# Patient Record
Sex: Female | Born: 1990 | Race: White | Hispanic: No | Marital: Single | Smoking: Current every day smoker
Health system: Southern US, Community
[De-identification: ages and names within clinical notes are randomized; demographics above are authoritative.]

## PROBLEM LIST (undated history)

## (undated) ENCOUNTER — Inpatient Hospital Stay: Payer: Self-pay

## (undated) DIAGNOSIS — F4310 Post-traumatic stress disorder, unspecified: Secondary | ICD-10-CM

## (undated) DIAGNOSIS — F99 Mental disorder, not otherwise specified: Secondary | ICD-10-CM

## (undated) DIAGNOSIS — F419 Anxiety disorder, unspecified: Secondary | ICD-10-CM

## (undated) DIAGNOSIS — F329 Major depressive disorder, single episode, unspecified: Secondary | ICD-10-CM

## (undated) DIAGNOSIS — F32A Depression, unspecified: Secondary | ICD-10-CM

## (undated) HISTORY — PX: APPENDECTOMY: SHX54

## (undated) HISTORY — PX: DILATION AND CURETTAGE OF UTERUS: SHX78

## (undated) HISTORY — PX: CHOLECYSTECTOMY: SHX55

---
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
## 2010-02-14 ENCOUNTER — Emergency Department: Payer: Self-pay | Admitting: Emergency Medicine

## 2010-02-18 ENCOUNTER — Other Ambulatory Visit: Payer: Self-pay | Admitting: Obstetrics & Gynecology

## 2010-04-04 ENCOUNTER — Emergency Department: Payer: Self-pay | Admitting: Emergency Medicine

## 2010-05-12 ENCOUNTER — Emergency Department: Payer: Self-pay | Admitting: Emergency Medicine

## 2011-03-29 ENCOUNTER — Emergency Department: Payer: Self-pay | Admitting: Emergency Medicine

## 2011-03-29 LAB — URINALYSIS, COMPLETE
Glucose,UR: NEGATIVE mg/dL (ref 0–75)
Ketone: NEGATIVE
Ph: 5 (ref 4.5–8.0)
Protein: NEGATIVE
RBC,UR: 2 /HPF (ref 0–5)
Specific Gravity: 1.023 (ref 1.003–1.030)

## 2016-02-14 ENCOUNTER — Encounter: Payer: Self-pay | Admitting: Emergency Medicine

## 2016-02-14 ENCOUNTER — Emergency Department
Admission: EM | Admit: 2016-02-14 | Discharge: 2016-02-15 | Disposition: A | Discharge status: Home / Self Care | Payer: Medicaid Other | Attending: Emergency Medicine | Admitting: Emergency Medicine

## 2016-02-14 DIAGNOSIS — F17210 Nicotine dependence, cigarettes, uncomplicated: Secondary | ICD-10-CM | POA: Insufficient documentation

## 2016-02-14 DIAGNOSIS — Z5181 Encounter for therapeutic drug level monitoring: Secondary | ICD-10-CM | POA: Insufficient documentation

## 2016-02-14 DIAGNOSIS — Z2913 Encounter for prophylactic Rho(D) immune globulin: Secondary | ICD-10-CM | POA: Diagnosis not present

## 2016-02-14 DIAGNOSIS — F329 Major depressive disorder, single episode, unspecified: Secondary | ICD-10-CM

## 2016-02-14 DIAGNOSIS — Z3A01 Less than 8 weeks gestation of pregnancy: Secondary | ICD-10-CM | POA: Diagnosis not present

## 2016-02-14 DIAGNOSIS — O99331 Smoking (tobacco) complicating pregnancy, first trimester: Secondary | ICD-10-CM | POA: Diagnosis not present

## 2016-02-14 DIAGNOSIS — Z3491 Encounter for supervision of normal pregnancy, unspecified, first trimester: Secondary | ICD-10-CM

## 2016-02-14 DIAGNOSIS — F32A Depression, unspecified: Secondary | ICD-10-CM

## 2016-02-14 DIAGNOSIS — O99341 Other mental disorders complicating pregnancy, first trimester: Secondary | ICD-10-CM | POA: Diagnosis present

## 2016-02-14 LAB — URINE DRUG SCREEN, QUALITATIVE (ARMC ONLY)
Amphetamines, Ur Screen: NOT DETECTED
Barbiturates, Ur Screen: NOT DETECTED
Benzodiazepine, Ur Scrn: POSITIVE — AB
CANNABINOID 50 NG, UR ~~LOC~~: POSITIVE — AB
COCAINE METABOLITE, UR ~~LOC~~: NOT DETECTED
MDMA (ECSTASY) UR SCREEN: NOT DETECTED
Methadone Scn, Ur: NOT DETECTED
Opiate, Ur Screen: NOT DETECTED
PHENCYCLIDINE (PCP) UR S: NOT DETECTED
Tricyclic, Ur Screen: NOT DETECTED

## 2016-02-14 LAB — COMPREHENSIVE METABOLIC PANEL
ALK PHOS: 70 U/L (ref 38–126)
ALT: 20 U/L (ref 14–54)
AST: 21 U/L (ref 15–41)
Albumin: 4.5 g/dL (ref 3.5–5.0)
Anion gap: 8 (ref 5–15)
BUN: 9 mg/dL (ref 6–20)
CALCIUM: 8.9 mg/dL (ref 8.9–10.3)
CHLORIDE: 106 mmol/L (ref 101–111)
CO2: 24 mmol/L (ref 22–32)
CREATININE: 0.62 mg/dL (ref 0.44–1.00)
GFR calc Af Amer: >60 mL/min (ref >60)
Glucose, Bld: 78 mg/dL (ref 65–99)
Potassium: 3.6 mmol/L (ref 3.5–5.1)
Sodium: 138 mmol/L (ref 135–145)
Total Bilirubin: 0.4 mg/dL (ref 0.3–1.2)
Total Protein: 8.1 g/dL (ref 6.5–8.1)

## 2016-02-14 LAB — CBC
HCT: 45.1 % (ref 35.0–47.0)
HEMOGLOBIN: 15.3 g/dL (ref 12.0–16.0)
MCH: 29.8 pg (ref 26.0–34.0)
MCHC: 33.8 g/dL (ref 32.0–36.0)
MCV: 88.2 fL (ref 80.0–100.0)
Platelets: 199 10*3/uL (ref 150–440)
RBC: 5.12 MIL/uL (ref 3.80–5.20)
RDW: 14 % (ref 11.5–14.5)
WBC: 10.7 10*3/uL (ref 3.6–11.0)

## 2016-02-14 LAB — ETHANOL

## 2016-02-14 LAB — POCT PREGNANCY, URINE: Preg Test, Ur: POSITIVE — AB

## 2016-02-14 LAB — ACETAMINOPHEN LEVEL: Acetaminophen (Tylenol), Serum: <10 ug/mL — ABNORMAL LOW (ref 10–30)

## 2016-02-14 LAB — SALICYLATE LEVEL: Salicylate Lvl: <7 mg/dL (ref 2.8–30.0)

## 2016-02-14 NOTE — ED Notes (Addendum)
Pt dressed out and belongings placed in belongings bag. (Pink shoes, blue jeans, black pants, tan sweat shirt, grey hoodie, hair tie and cell phone). Pt has lip piercing. Pt tried to take it off and couldn't and this tech tried to take it off as well and couldn't. Amy informed about this.

## 2016-02-14 NOTE — ED Notes (Signed)
Pt feels like she is having a panic attack, hx of the same.

## 2016-02-14 NOTE — ED Notes (Signed)
Yvonne Shea [phone], pt's boyfriend phone number left on chart when pt ready for d/c

## 2016-02-14 NOTE — ED Triage Notes (Addendum)
Patient ambulatory to triage with steady gait, without difficulty; pt tearful and anxious, bouncing leg rapidly; pt reports depression, crying spells x 3 days; st on no meds for such; when questioned pt regarding SI, st "well kinda but I don't really want to say"

## 2016-02-14 NOTE — ED Provider Notes (Signed)
[facility] Emergency Department Provider Note  ____________________________________________  Time seen: 11:21 PM  I have reviewed the triage vital signs and the nursing notes.   HISTORY  Chief Complaint Mental Health Problem      HPI Yvonne Shea is a 25 y.o. female presents with feeling depressed secondary to separation from her children who are "100 miles away". States that she's been expressing crying spells with past 3 days. Patient denies any suicidal ideation or homicidal ideation. Urine pregnancy obtained by the nursing staff arrival to the emergency department was positive. Patient states her last menstrual period ended 3 days ago.     History reviewed. No pertinent past medical history.  There are no active problems to display for this patient.   Past Surgical History:  Procedure Laterality Date  . APPENDECTOMY    . CESAREAN SECTION    . CHOLECYSTECTOMY    . DILATION AND CURETTAGE OF UTERUS        Allergies Patient has no known allergies.  No family history on file.  Social History Social History  Substance Use Topics  . Smoking status: Current Every Day Smoker    Packs/day: 1.00    Types: Cigarettes  . Smokeless tobacco: Never Used  . Alcohol use No    Review of Systems  Constitutional: Negative for fever. Eyes: Negative for visual changes. ENT: Negative for sore throat. Cardiovascular: Negative for chest pain. Respiratory: Negative for shortness of breath. Gastrointestinal: Negative for abdominal pain, vomiting and diarrhea. Genitourinary: Negative for dysuria. Musculoskeletal: Negative for back pain. Skin: Negative for rash. Neurological: Negative for headaches, focal weakness or numbness. Psychiatric:Positive for depression  10-point ROS otherwise negative.  ____________________________________________   PHYSICAL EXAM:  VITAL SIGNS: ED Triage Vitals  Enc Vitals Group     BP 02/14/16 2253 101/78   Pulse Rate 02/14/16 2253 (!) 112     Resp 02/14/16 2253 18     Temp 02/14/16 2253 98 F (36.7 C)     Temp src --      SpO2 02/14/16 2253 100 %     Weight 02/14/16 2252 150 lb (68 kg)     Height 02/14/16 2252 5\' 4"  (1.626 m)     Head Circumference --      Peak Flow --      Pain Score --      Pain Loc --      Pain Edu? --      Excl. in GC? --      Constitutional: Alert and oriented. Well appearing and in no distress. Eyes: Conjunctivae are normal. PERRL. Normal extraocular movements. ENT   Head: Normocephalic and atraumatic.   Nose: No congestion/rhinnorhea.   Mouth/Throat: Mucous membranes are moist.   Neck: No stridor. Hematological/Lymphatic/Immunilogical: No cervical lymphadenopathy. Cardiovascular: Normal rate, regular rhythm. Normal and symmetric distal pulses are present in all extremities. No murmurs, rubs, or gallops. Respiratory: Normal respiratory effort without tachypnea nor retractions. Breath sounds are clear and equal bilaterally. No wheezes/rales/rhonchi. Gastrointestinal: Soft and nontender. No distention. There is no CVA tenderness. Genitourinary: deferred Musculoskeletal: Nontender with normal range of motion in all extremities. No joint effusions.  No lower extremity tenderness nor edema. Neurologic:  Normal speech and language. No gross focal neurologic deficits are appreciated. Speech is normal.  Skin:  Skin is warm, dry and intact. No rash noted. Psychiatric: Mood and affect are normal. Speech and behavior are normal. Patient exhibits appropriate insight and judgment.  ____________________________________________    LABS (pertinent positives/negatives)  Labs Reviewed  ACETAMINOPHEN LEVEL - Abnormal; Notable for the following:       Result Value   Acetaminophen (Tylenol), Serum <10 (*)    All other components within normal limits  URINE DRUG SCREEN, QUALITATIVE ([facility] ONLY) - Abnormal; Notable for the following:    Cannabinoid 50 Ng, Ur [place]  POSITIVE (*)    Benzodiazepine, Ur Scrn POSITIVE (*)    All other components within normal limits  HCG, QUANTITATIVE, PREGNANCY - Abnormal; Notable for the following:    hCG, Beta Chain, Quant, S 958 (*)    All other components within normal limits  POCT PREGNANCY, URINE - Abnormal; Notable for the following:    Preg Test, Ur POSITIVE (*)    All other components within normal limits  COMPREHENSIVE METABOLIC PANEL  ETHANOL  SALICYLATE LEVEL  CBC  POC URINE PREG, ED  ABO/RH       RADIOLOGY  CLINICAL DATA:  Acute onset of panic attacks.  Initial encounter.  EXAM: OBSTETRIC <14 WK US AND TRANSVAGINAL OB US  TECHNIQUE: Both transabdominal and transvaginal ultrasound examinations were performed for complete evaluation of the gestation as well as the maternal uterus, adnexal regions, and pelvic cul-de-sac. Transvaginal technique was performed to assess early pregnancy.  COMPARISON:  Pelvic ultrasound performed 02/14/2010  FINDINGS: Intrauterine gestational sac: Single; visualized and normal in shape.  Yolk sac:  No  Embryo:  No  MSD: 3.0 mm   5 w   0  d  Subchorionic hemorrhage:  None visualized.  Maternal uterus/adnexae: The uterus is otherwise unremarkable.  The ovaries are within normal limits. The right ovary measures 4.0 x 1.7 x 2.7 cm, while the left ovary measures 2.9 x 1.3 x 2.0 cm. No suspicious adnexal masses are seen; there is no evidence for ovarian torsion.  No free fluid is seen within the pelvic cul-de-sac.  IMPRESSION: Single intrauterine gestational sac noted, with a mean sac diameter of 3 mm, corresponding to a gestational age of [redacted] weeks 0 days. It remains too early to determine an estimated date of delivery. No yolk sac or embryo is yet seen, within normal limits. Pelvic ultrasound could be performed in 2 weeks for further evaluation, if quantitative beta HCG continues to trend upward.   Electronically Signed   By: Roanna RaiderJeffery   Chang M.D.   On: 02/15/2016 01:42    Procedures    INITIAL IMPRESSION / ASSESSMENT AND PLAN / ED COURSE  Pertinent labs & imaging results that were available during my care of the patient were reviewed by me and considered in my medical decision making (see chart for details).  Patient noted to have a positive pregnancy tests and arrival to the emergency department. She denied any current vaginal bleeding stating that her last menstrual period ended last Friday. Ultrasound revealed no intrauterine gestational sac as mentioned above approximate gestational age of [redacted] weeks and 0 days. Patient is AB- and a such will receive Rogam  ____________________________________________   FINAL CLINICAL IMPRESSION(S) / ED DIAGNOSES  Final diagnoses:  First trimester pregnancy  Depression, unspecified depression type      Darci Currentandolph N Saachi Zale, MD 02/15/16 [phone]

## 2016-02-14 NOTE — ED Notes (Signed)
Pt states she is stressed out because she is not going to see children this week for the holiday.  Pt sates kids live 100 miles away with father and recently 1 child had surgery and mother is also sick.  Pt reports feeling anxious with chest tightness.  Pt also reports crying spells.  Pt alert and cooperative.

## 2016-02-15 ENCOUNTER — Emergency Department: Payer: Medicaid Other

## 2016-02-15 LAB — HCG, QUANTITATIVE, PREGNANCY: hCG, Beta Chain, Quant, S: 958 m[IU]/mL — ABNORMAL HIGH (ref <5)

## 2016-02-15 LAB — ANTIBODY SCREEN: ANTIBODY SCREEN: NEGATIVE

## 2016-02-15 LAB — ABO/RH: ABO/RH(D): AB NEG

## 2016-02-15 MED ORDER — RHO D IMMUNE GLOBULIN 1500 UNIT/2ML IJ SOSY
300.0000 ug | PREFILLED_SYRINGE | Freq: Once | INTRAMUSCULAR | Status: AC
  Administered 2016-02-15: 300 ug via INTRAMUSCULAR
  Filled 2016-02-15: qty 2

## 2016-02-15 NOTE — ED Notes (Signed)
Pt in u/s

## 2016-02-15 NOTE — ED Notes (Signed)
Pt resting eyes closed

## 2016-02-15 NOTE — ED Provider Notes (Signed)
-----------------------------------------   12:52 PM on 02/15/2016 -----------------------------------------   Blood pressure (!) 96/49, pulse 68, temperature 98.3 F (36.8 C), temperature source Oral, resp. rate 20, height 5\' 4"  (1.626 m), weight 150 lb (68 kg), SpO2 100 %.  Patient is here voluntarily for depressive symptoms. No SI or HI. Patient does not wish to wait for psychiatry evaluation anymore. I reassessed her she continues to deny any suicidal or homicidal ideation. Patient received RhoGAM for vaginal bleeding in the first trimester of her pregnancy as she is AB neg. Ultrasound showing an IUP at 5 weeks and 0 days. Tox screen positive for marijuana and benzos. Remaining of her blood work is within normal limits with no acute findings. I encouraged patient to return to the emergency room if she continues to feel depressed or she develops any suicidal or homicidal ideations. Patient is in agreement. We'll discharge her home at this time.   Nita Sicklearolina Keyontay Stolz, MD 02/15/16 [phone]

## 2016-02-15 NOTE — Discharge Instructions (Signed)
You have been seen in the Emergency Department (ED)  today for a psychiatric complaint.  You have been evaluated by psychiatry and we believe you are safe to be discharged from the hospital.   ° °Please return to the Emergency Department (ED)  immediately if you have ANY thoughts of hurting yourself or anyone else, so that we may help you. ° °Please avoid alcohol and drug use. ° °Follow up with your doctor and/or therapist as soon as possible regarding today's ED  visit.  ° °You may call crisis hotline for [place] at [phone]. ° °

## 2016-02-16 LAB — RHOGAM INJECTION: UNIT DIVISION: 0

## 2016-03-06 DIAGNOSIS — F17210 Nicotine dependence, cigarettes, uncomplicated: Secondary | ICD-10-CM | POA: Insufficient documentation

## 2016-03-06 DIAGNOSIS — O99331 Smoking (tobacco) complicating pregnancy, first trimester: Secondary | ICD-10-CM | POA: Insufficient documentation

## 2016-03-06 DIAGNOSIS — O23591 Infection of other part of genital tract in pregnancy, first trimester: Secondary | ICD-10-CM | POA: Diagnosis not present

## 2016-03-06 DIAGNOSIS — O26891 Other specified pregnancy related conditions, first trimester: Secondary | ICD-10-CM | POA: Diagnosis present

## 2016-03-06 DIAGNOSIS — O2301 Infections of kidney in pregnancy, first trimester: Secondary | ICD-10-CM | POA: Diagnosis not present

## 2016-03-06 DIAGNOSIS — R102 Pelvic and perineal pain: Secondary | ICD-10-CM | POA: Insufficient documentation

## 2016-03-06 DIAGNOSIS — Z3A01 Less than 8 weeks gestation of pregnancy: Secondary | ICD-10-CM | POA: Insufficient documentation

## 2016-03-06 LAB — URINALYSIS, COMPLETE (UACMP) WITH MICROSCOPIC
BILIRUBIN URINE: NEGATIVE
Glucose, UA: NEGATIVE mg/dL
HGB URINE DIPSTICK: NEGATIVE
Ketones, ur: 5 mg/dL — AB
NITRITE: POSITIVE — AB
PH: 6 (ref 5.0–8.0)
Protein, ur: 30 mg/dL — AB
SPECIFIC GRAVITY, URINE: 1.017 (ref 1.005–1.030)

## 2016-03-06 LAB — COMPREHENSIVE METABOLIC PANEL
ALT: 13 U/L — AB (ref 14–54)
AST: 18 U/L (ref 15–41)
Albumin: 4.2 g/dL (ref 3.5–5.0)
Alkaline Phosphatase: 56 U/L (ref 38–126)
Anion gap: 8 (ref 5–15)
BILIRUBIN TOTAL: 0.7 mg/dL (ref 0.3–1.2)
BUN: 10 mg/dL (ref 6–20)
CALCIUM: 9.3 mg/dL (ref 8.9–10.3)
CO2: 25 mmol/L (ref 22–32)
CREATININE: 0.6 mg/dL (ref 0.44–1.00)
Chloride: 103 mmol/L (ref 101–111)
GFR calc Af Amer: >60 mL/min (ref >60)
Glucose, Bld: 112 mg/dL — ABNORMAL HIGH (ref 65–99)
Potassium: 3.4 mmol/L — ABNORMAL LOW (ref 3.5–5.1)
Sodium: 136 mmol/L (ref 135–145)
TOTAL PROTEIN: 7.9 g/dL (ref 6.5–8.1)

## 2016-03-06 LAB — CBC
HCT: 42.6 % (ref 35.0–47.0)
Hemoglobin: 14.6 g/dL (ref 12.0–16.0)
MCH: 30.5 pg (ref 26.0–34.0)
MCHC: 34.2 g/dL (ref 32.0–36.0)
MCV: 89 fL (ref 80.0–100.0)
PLATELETS: 202 10*3/uL (ref 150–440)
RBC: 4.78 MIL/uL (ref 3.80–5.20)
RDW: 14.2 % (ref 11.5–14.5)
WBC: 11.9 10*3/uL — AB (ref 3.6–11.0)

## 2016-03-06 LAB — HCG, QUANTITATIVE, PREGNANCY: hCG, Beta Chain, Quant, S: 62964 m[IU]/mL — ABNORMAL HIGH (ref <5)

## 2016-03-06 LAB — POCT PREGNANCY, URINE: PREG TEST UR: POSITIVE — AB

## 2016-03-06 NOTE — ED Triage Notes (Addendum)
Pt presents to ED with c/o RLQ abdominal pain and bilateral lower back pain associated with nausea and vomiting. Pt reports 7-8 episodes of emesis, denies fever. Pt is [redacted] weeks pregnant, reports some vaginal bleeding (spotting) 2 days ago, but denies bleeding or d/c at this time. Pt states increase in urinary frequency, but denies painful or burning with urination. Pt denies any previous h/x of kidney stones.

## 2016-03-07 ENCOUNTER — Emergency Department
Admission: EM | Admit: 2016-03-07 | Discharge: 2016-03-07 | Disposition: A | Discharge status: Home / Self Care | Payer: Medicaid Other | Attending: Emergency Medicine | Admitting: Emergency Medicine

## 2016-03-07 ENCOUNTER — Emergency Department: Payer: Medicaid Other

## 2016-03-07 DIAGNOSIS — B9689 Other specified bacterial agents as the cause of diseases classified elsewhere: Secondary | ICD-10-CM

## 2016-03-07 DIAGNOSIS — N10 Acute tubulo-interstitial nephritis: Secondary | ICD-10-CM

## 2016-03-07 DIAGNOSIS — N760 Acute vaginitis: Secondary | ICD-10-CM

## 2016-03-07 LAB — CHLAMYDIA/NGC RT PCR (ARMC ONLY)
Chlamydia Tr: NOT DETECTED
N GONORRHOEAE: NOT DETECTED

## 2016-03-07 LAB — WET PREP, GENITAL
SPERM: NONE SEEN
Trich, Wet Prep: NONE SEEN
YEAST WET PREP: NONE SEEN

## 2016-03-07 MED ORDER — SODIUM CHLORIDE 0.9 % IV BOLUS (SEPSIS)
1000.0000 mL | Freq: Once | INTRAVENOUS | Status: AC
  Administered 2016-03-07: 1000 mL via INTRAVENOUS

## 2016-03-07 MED ORDER — CEPHALEXIN 500 MG PO CAPS: 500.0000 mg | ORAL_CAPSULE | Freq: Four times a day (QID) | ORAL | 0 refills | Status: AC

## 2016-03-07 MED ORDER — METOCLOPRAMIDE HCL 5 MG/ML IJ SOLN
10.0000 mg | Freq: Once | INTRAMUSCULAR | Status: AC
  Administered 2016-03-07: 10 mg via INTRAVENOUS
  Filled 2016-03-07: qty 2

## 2016-03-07 MED ORDER — CEFTRIAXONE SODIUM-DEXTROSE 1-3.74 GM-% IV SOLR
1.0000 g | Freq: Once | INTRAVENOUS | Status: AC
  Administered 2016-03-07: 1 g via INTRAVENOUS
  Filled 2016-03-07: qty 50

## 2016-03-07 MED ORDER — METOCLOPRAMIDE HCL 10 MG PO TABS: 10.0000 mg | ORAL_TABLET | Freq: Three times a day (TID) | ORAL | 0 refills | Status: DC | PRN

## 2016-03-07 MED ORDER — DEXTROSE 5 % IV SOLN: 1.0000 g | Freq: Once | INTRAVENOUS | Status: DC

## 2016-03-07 MED ORDER — METRONIDAZOLE 500 MG PO TABS: 500.0000 mg | ORAL_TABLET | Freq: Two times a day (BID) | ORAL | 0 refills | Status: AC

## 2016-03-07 NOTE — ED Notes (Signed)
Pt. Transported to US at this time

## 2016-03-07 NOTE — ED Notes (Signed)
Pt reports back pain that feels like a "stabbing sharp pain." Pt also reports cramps in lower abdominal. Denies any current bleeding.

## 2016-03-07 NOTE — ED Provider Notes (Signed)
[facility] Emergency Department Provider Note   ____________________________________________   First MD Initiated Contact with Patient 03/07/16 [phone]     (approximate)  I have reviewed the triage vital signs and the nursing notes.   HISTORY  Chief Complaint Back Pain and Abdominal Pain    HPI Yvonne Shea is a 25 y.o. female who comes into the hospital today with some pelvic pain and back pain. She reports it started around 7:30 this evening. She reports that the pelvic pain is not that bad but the back pain is really bad. The patient has not taken anything. She is [redacted] weeks pregnant but has not yet seen an OB/GYN. The patient is a G5 P2 0-2. She reports that she had some spotting a couple of days ago. She denies any pain with urination. She's had some nausea and vomiting. She denies any fevers but has had some chills and hot flashes. Today she has not been eating and drinking well but she had been doing well previously. The patient rates her pain a 9 out of 10 in intensity. She is here for evaluation.   History reviewed. No pertinent past medical history.  There are no active problems to display for this patient.   Past Surgical History:  Procedure Laterality Date  . APPENDECTOMY    . CESAREAN SECTION    . CHOLECYSTECTOMY    . DILATION AND CURETTAGE OF UTERUS      Prior to Admission medications   Medication Sig Start Date End Date Taking? Authorizing Provider  cephALEXin (KEFLEX) 500 MG capsule Take 1 capsule (500 mg total) by mouth 4 (four) times daily. 03/07/16 03/17/16  Rebecka ApleyAllison P Yoshiaki Kreuser, MD  metoCLOPramide (REGLAN) 10 MG tablet Take 1 tablet (10 mg total) by mouth every 8 (eight) hours as needed. 03/07/16   Rebecka ApleyAllison P Shashana Fullington, MD  metroNIDAZOLE (FLAGYL) 500 MG tablet Take 1 tablet (500 mg total) by mouth 2 (two) times daily. 03/07/16 03/14/16  Rebecka ApleyAllison P Jaymien Landin, MD    Allergies Patient has no known allergies.  No family history on  file.  Social History Social History  Substance Use Topics  . Smoking status: Current Every Day Smoker    Packs/day: 1.00    Types: Cigarettes  . Smokeless tobacco: Never Used  . Alcohol use No    Review of Systems Constitutional: No fever/chills Eyes: No visual changes. ENT: No sore throat. Cardiovascular: Denies chest pain. Respiratory: Denies shortness of breath. Gastrointestinal: abdominal pain, nausea, vomiting.  No diarrhea.  No constipation. Genitourinary: Negative for dysuria. Musculoskeletal: back pain. Skin: Negative for rash. Neurological: Negative for headaches, focal weakness or numbness.  10-point ROS otherwise negative.  ____________________________________________   PHYSICAL EXAM:  VITAL SIGNS: ED Triage Vitals [03/06/16 2115]  Enc Vitals Group     BP (!) 113/53     Pulse Rate (!) 103     Resp 18     Temp 97.5 F (36.4 C)     Temp Source Oral     SpO2 96 %     Weight 151 lb (68.5 kg)     Height 5\' 4"  (1.626 m)     Head Circumference      Peak Flow      Pain Score 10     Pain Loc      Pain Edu?      Excl. in GC?     Constitutional: Alert and oriented. Well appearing and in mild distress Eyes: Conjunctivae are normal. PERRL. EOMI. Head:  Atraumatic. Nose: No congestion/rhinnorhea. Mouth/Throat: Mucous membranes are moist.  Oropharynx non-erythematous. Cardiovascular: Normal rate, regular rhythm. Grossly normal heart sounds.  Good peripheral circulation. Respiratory: Normal respiratory effort.  No retractions. Lungs CTAB. Gastrointestinal: Soft with some mild lower abd tenderness to palpation. No distention. No abdominal bruits. No CVA tenderness. Genitourinary: Musculoskeletal: No lower extremity tenderness nor edema.   Neurologic:  Normal speech and language.  Skin:  Skin is warm, dry and intact.  Psychiatric: Mood and affect are normal.   ____________________________________________   LABS (all labs ordered are listed, but only  abnormal results are displayed)  Labs Reviewed  WET PREP, GENITAL - Abnormal; Notable for the following:       Result Value   Clue Cells Wet Prep HPF POC PRESENT (*)    WBC, Wet Prep HPF POC MODERATE (*)    All other components within normal limits  COMPREHENSIVE METABOLIC PANEL - Abnormal; Notable for the following:    Potassium 3.4 (*)    Glucose, Bld 112 (*)    ALT 13 (*)    All other components within normal limits  CBC - Abnormal; Notable for the following:    WBC 11.9 (*)    All other components within normal limits  URINALYSIS, COMPLETE (UACMP) WITH MICROSCOPIC - Abnormal; Notable for the following:    Color, Urine YELLOW (*)    APPearance TURBID (*)    Ketones, ur 5 (*)    Protein, ur 30 (*)    Nitrite POSITIVE (*)    Leukocytes, UA TRACE (*)    Bacteria, UA FEW (*)    Squamous Epithelial / LPF 6-30 (*)    All other components within normal limits  HCG, QUANTITATIVE, PREGNANCY - Abnormal; Notable for the following:    hCG, Beta Chain, Quant, S 40,98162,964 (*)    All other components within normal limits  POCT PREGNANCY, URINE - Abnormal; Notable for the following:    Preg Test, Ur POSITIVE (*)    All other components within normal limits  URINE CULTURE  CULTURE, BLOOD (ROUTINE X 2)  CULTURE, BLOOD (ROUTINE X 2)  CHLAMYDIA/NGC RT PCR ([facility] ONLY)  POC URINE PREG, ED   ____________________________________________  EKG  none ____________________________________________  RADIOLOGY  OB [place] US renal ____________________________________________   PROCEDURES  Procedure(s) performed: None  Procedures  Critical Care performed: No  ____________________________________________   INITIAL IMPRESSION / ASSESSMENT AND PLAN / ED COURSE  Pertinent labs & imaging results that were available during my care of the patient were reviewed by me and considered in my medical decision making (see chart for details).  This is a 25 year old female who comes into the  hospital today with lower abdominal pain and back pain. The patient does appear to have a urinary tract infection. I'll give the patient a liter of normal saline and some Reglan. I'll also give her dose of ceftriaxone. I will send the patient for an ultrasound to evaluate the pregnancy and I will reassess the patient.  Clinical Course as of Mar 07 545  Tue Mar 07, 2016  0334 1. Single live intrauterine pregnancy with estimated gestational age of [redacted] weeks and 6 days by crown-rump length. 2. No acute process identified.   US OB Transvaginal [AW]  0334 Normal renal ultrasound.  No hydronephrosis. US Renal [AW]    Clinical Course User Index [AW] Rebecka ApleyAllison P Carly Sabo, MD    The patient's ultrasound estimates her gestational age of [redacted] weeks and 6 days. The patient's wet prep has  clue cells and she does have a UTI. The patient will be discharged to home to follow-up with Dr. Jean Rosenthal. ____________________________________________   FINAL CLINICAL IMPRESSION(S) / ED DIAGNOSES  Final diagnoses:  Acute pyelonephritis  Bacterial vaginosis      NEW MEDICATIONS STARTED DURING THIS VISIT:  New Prescriptions   CEPHALEXIN (KEFLEX) 500 MG CAPSULE    Take 1 capsule (500 mg total) by mouth 4 (four) times daily.   METOCLOPRAMIDE (REGLAN) 10 MG TABLET    Take 1 tablet (10 mg total) by mouth every 8 (eight) hours as needed.   METRONIDAZOLE (FLAGYL) 500 MG TABLET    Take 1 tablet (500 mg total) by mouth 2 (two) times daily.     Note:  This document was prepared using Dragon voice recognition software and may include unintentional dictation errors.    Rebecka Apley, MD 03/07/16 [phone]

## 2016-03-07 NOTE — Discharge Instructions (Signed)
Please return with worsening symptoms or any other concerns.

## 2016-03-08 LAB — URINE CULTURE

## 2016-03-12 LAB — CULTURE, BLOOD (ROUTINE X 2)
Culture: NO GROWTH
Culture: NO GROWTH

## 2016-03-30 ENCOUNTER — Other Ambulatory Visit: Payer: Self-pay | Admitting: Obstetrics and Gynecology

## 2016-03-30 DIAGNOSIS — Z369 Encounter for antenatal screening, unspecified: Secondary | ICD-10-CM

## 2016-04-17 ENCOUNTER — Ambulatory Visit (HOSPITAL_BASED_OUTPATIENT_CLINIC_OR_DEPARTMENT_OTHER)
Admission: RE | Admit: 2016-04-17 | Discharge: 2016-04-17 | Disposition: A | Discharge status: Home / Self Care | Payer: Medicaid Other | Source: Ambulatory Visit | Attending: Obstetrics and Gynecology | Admitting: Obstetrics and Gynecology

## 2016-04-17 ENCOUNTER — Encounter: Payer: Self-pay | Admitting: *Deleted

## 2016-04-17 ENCOUNTER — Ambulatory Visit
Admission: RE | Admit: 2016-04-17 | Discharge: 2016-04-17 | Disposition: A | Discharge status: Home / Self Care | Payer: Medicaid Other | Source: Ambulatory Visit | Attending: Obstetrics and Gynecology | Admitting: Obstetrics and Gynecology

## 2016-04-17 VITALS — BP 110/59 | HR 94 | Temp 97.5°F | Resp 17 | Ht 64.0 in | Wt 150.0 lb

## 2016-04-17 DIAGNOSIS — Z369 Encounter for antenatal screening, unspecified: Secondary | ICD-10-CM

## 2016-04-17 DIAGNOSIS — Z3A13 13 weeks gestation of pregnancy: Secondary | ICD-10-CM | POA: Diagnosis not present

## 2016-04-17 DIAGNOSIS — Z3480 Encounter for supervision of other normal pregnancy, unspecified trimester: Secondary | ICD-10-CM | POA: Insufficient documentation

## 2016-04-17 NOTE — Progress Notes (Signed)
Yvonne Wells, MS, CGC performed an integral service incident to the physician's initial service.  I was physically present in the clinical area and was immediately available to render assistance.   [name] C [name]  

## 2016-04-17 NOTE — Progress Notes (Addendum)
Referring physician:  [facility] Length of Consultation: 45 minutes   Yvonne Shea  was referred to [facility] for genetic counseling to review prenatal screening and testing options.  This note summarizes the information we discussed.    We offered the following routine screening tests for this pregnancy:  First trimester screening, which includes nuchal translucency ultrasound screen and first trimester maternal serum marker screening.  The nuchal translucency has approximately an 80% detection rate for Down syndrome and can be positive for other chromosome abnormalities as well as congenital heart defects.  When combined with a maternal serum marker screening, the detection rate is up to 90% for Down syndrome and up to 97% for trisomy 18.     Maternal serum marker screening, a blood test that measures pregnancy proteins, can provide risk assessments for Down syndrome, trisomy 18, and open neural tube defects (spina bifida, anencephaly). Because it does not directly examine the fetus, it cannot positively diagnose or rule out these problems.  Targeted ultrasound uses high frequency sound waves to create an image of the developing fetus.  An ultrasound is often recommended as a routine means of evaluating the pregnancy.  It is also used to screen for fetal anatomy problems (for example, a heart defect) that might be suggestive of a chromosomal or other abnormality.   Should these screening tests indicate an increased concern, then the following additional testing options would be offered:  The chorionic villus sampling procedure is available for first trimester chromosome analysis.  This involves the withdrawal of a small amount of chorionic villi (tissue from the developing placenta).  Risk of pregnancy loss is estimated to be approximately 1 in 200 to 1 in 100 (0.5 to 1%).  There is approximately a 1% (1 in 100) chance that the CVS chromosome results will be unclear.   Chorionic villi cannot be tested for neural tube defects.     Amniocentesis involves the removal of a small amount of amniotic fluid from the sac surrounding the fetus with the use of a thin needle inserted through the maternal abdomen and uterus.  Ultrasound guidance is used throughout the procedure.  Fetal cells from amniotic fluid are directly evaluated and > 99.5% of chromosome problems and > 98% of open neural tube defects can be detected. This procedure is generally performed after the 15th week of pregnancy.  The main risks to this procedure include complications leading to miscarriage in less than 1 in 200 cases (0.5%).  As another option for information if the pregnancy is suspected to be an an increased chance for certain chromosome conditions, we also reviewed the availability of cell free fetal DNA testing from maternal blood to determine whether or not the baby may have either Down syndrome, trisomy 68, or trisomy 63.  This test utilizes a maternal blood sample and DNA sequencing technology to isolate circulating cell free fetal DNA from maternal plasma.  The fetal DNA can then be analyzed for DNA sequences that are derived from the three most common chromosomes involved in aneuploidy, chromosomes 13, 18, and 21.  If the overall amount of DNA is greater than the expected level for any of these chromosomes, aneuploidy is suspected.  While we do not consider it a replacement for invasive testing and karyotype analysis, a negative result from this testing would be reassuring, though not a guarantee of a normal chromosome complement for the baby.  An abnormal result is certainly suggestive of an abnormal chromosome complement, though  we would still recommend CVS or amniocentesis to confirm any findings from this testing.  Cystic Fibrosis and Spinal Muscular Atrophy (SMA) screening were also discussed with the patient. Both conditions are recessive, which means that both parents must be carriers in  order to have a child with the disease.  Cystic fibrosis (CF) is one of the most common genetic conditions in persons of Caucasian ancestry.  This condition occurs in approximately 1 in 2,500 Caucasian persons and results in thickened secretions in the lungs, digestive, and reproductive systems.  For a baby to be at risk for having CF, both of the parents must be carriers for this condition.  Approximately 1 in 6025 Caucasian persons is a carrier for CF.  Current carrier testing looks for the most common mutations in the gene for CF and can detect approximately 90% of carriers in the Caucasian population.  This means that the carrier screening can greatly reduce, but cannot eliminate, the chance for an individual to have a child with CF.  If an individual is found to be a carrier for CF, then carrier testing would be available for the partner. As part of [place]'s newborn screening profile, all babies born in the state of [place] will have a two-tier screening process.  Specimens are first tested to determine the concentration of immunoreactive trypsinogen (IRT).  The top 5% of specimens with the highest IRT values then undergo DNA testing using a panel of over 40 common CF mutations. SMA is a neurodegenerative disorder that leads to atrophy of skeletal muscle and overall weakness.  This condition is also more prevalent in the Caucasian population, with 1 in 40-1 in 60 persons being a carrier and 1 in 6,000-1 in 10,000 children being affected.  There are multiple forms of the disease, with some causing death in infancy to other forms with survival into adulthood.  The genetics of SMA is complex, but carrier screening can detect up to 95% of carriers in the Caucasian population.  Similar to CF, a negative result can greatly reduce, but cannot eliminate, the chance to have a child with SMA.  The patient is of Caucasian background, and the father of the baby is reported to be mixed Caucasian and African  American.  We therefore also offered hemoglobinopathy testing to Yvonne Shea.  We obtained a detailed family history and pregnancy history.  The family history was reported to be unremarkable for birth defects, mental retardation, recurrent pregnancy loss or known chromosome abnormalities.  The patient and her mother are said to have ADHD, which we reviewed may have strong genetic factors in some families.  The father of the baby is said to have had a "leaky heart valve" as a baby that resolved without any treatment.  We would not expect this history to significantly increase the risk for birth defects in this pregnancy.  Yvonne Shea stated that this is her fifth pregnancy, the first with the father of this baby.  However, she is no longer with him and came today with her new boyfriend.  She has two healthy daughters, ages 2 and 6 years.  She also had two early miscarriages with prior partners.  She reported no complications or exposures in this pregnancy that would be expected to increase the risk for birth defects.  After consideration of the options, Yvonne Shea elected to proceed with carrier testing for hemoglobinopathies, CF and SMA.  She desired first trimester screening, however, this could not be completed due to the ultrasound  measurements placing the gestational age above the cutoff of 13 weeks, 6 days.  An ultrasound was performed at the time of the visit.  The crown rump length was too large for first trimester screening to be completed.  We therefore let her know about the option of second trimester maternal serum screening at [facility].  Fetal anatomy could not be assessed due to early gestational age, so we would recommend an ultrasound for anatomy after [redacted] weeks gestation.  Please refer to the ultrasound report for details of that study.  Yvonne Shea was encouraged to call with questions or concerns.  We can be contacted at [phone].    Cherly Anderson, MS, CGC

## 2016-04-18 LAB — MISC LABCORP TEST (SEND OUT): LABCORP TEST CODE: 450010

## 2016-04-18 LAB — HEMOGLOBINOPATHY EVALUATION
HGB C: 0 %
HGB S QUANTITAION: 0 %
Hgb A2 Quant: 2.3 % (ref 1.8–3.2)
Hgb A: 97.7 % (ref 96.4–98.8)
Hgb F Quant: 0 % (ref 0.0–2.0)
Hgb Variant: 0 %

## 2016-04-20 LAB — CYSTIC FIBROSIS GENE TEST

## 2016-05-28 ENCOUNTER — Encounter: Payer: Self-pay | Admitting: Emergency Medicine

## 2016-05-28 ENCOUNTER — Observation Stay
Admission: EM | Admit: 2016-05-28 | Discharge: 2016-05-29 | Disposition: A | Discharge status: Other Acute Inpatient Hospital | Payer: Medicaid Other | Attending: Obstetrics and Gynecology | Admitting: Obstetrics and Gynecology

## 2016-05-28 DIAGNOSIS — F17210 Nicotine dependence, cigarettes, uncomplicated: Secondary | ICD-10-CM | POA: Insufficient documentation

## 2016-05-28 DIAGNOSIS — O479 False labor, unspecified: Secondary | ICD-10-CM | POA: Diagnosis present

## 2016-05-28 DIAGNOSIS — O99332 Smoking (tobacco) complicating pregnancy, second trimester: Secondary | ICD-10-CM | POA: Diagnosis not present

## 2016-05-28 DIAGNOSIS — O411220 Chorioamnionitis, second trimester, not applicable or unspecified: Secondary | ICD-10-CM | POA: Diagnosis present

## 2016-05-28 DIAGNOSIS — Z3A19 19 weeks gestation of pregnancy: Secondary | ICD-10-CM | POA: Insufficient documentation

## 2016-05-28 DIAGNOSIS — R102 Pelvic and perineal pain: Secondary | ICD-10-CM

## 2016-05-28 DIAGNOSIS — O26899 Other specified pregnancy related conditions, unspecified trimester: Secondary | ICD-10-CM

## 2016-05-28 LAB — CBC WITH DIFFERENTIAL/PLATELET
Basophils Absolute: 0 10*3/uL (ref 0–0.1)
Basophils Relative: 0 %
Eosinophils Absolute: 0.1 10*3/uL (ref 0–0.7)
Eosinophils Relative: 1 %
HCT: 36.8 % (ref 35.0–47.0)
Hemoglobin: 12.9 g/dL (ref 12.0–16.0)
Lymphocytes Relative: 7 %
Lymphs Abs: 1.2 10*3/uL (ref 1.0–3.6)
MCH: 30.7 pg (ref 26.0–34.0)
MCHC: 34.9 g/dL (ref 32.0–36.0)
MCV: 88 fL (ref 80.0–100.0)
Monocytes Absolute: 0.8 10*3/uL (ref 0.2–0.9)
Monocytes Relative: 5 %
Neutro Abs: 15.5 10*3/uL — ABNORMAL HIGH (ref 1.4–6.5)
Neutrophils Relative %: 87 %
Platelets: 200 10*3/uL (ref 150–440)
RBC: 4.19 MIL/uL (ref 3.80–5.20)
RDW: 14.2 % (ref 11.5–14.5)
WBC: 17.7 10*3/uL — ABNORMAL HIGH (ref 3.6–11.0)

## 2016-05-28 LAB — BASIC METABOLIC PANEL WITH GFR
Anion gap: 9 (ref 5–15)
BUN: 9 mg/dL (ref 6–20)
CO2: 20 mmol/L — ABNORMAL LOW (ref 22–32)
Calcium: 9 mg/dL (ref 8.9–10.3)
Chloride: 104 mmol/L (ref 101–111)
Creatinine, Ser: 0.53 mg/dL (ref 0.44–1.00)
GFR calc Af Amer: >60 mL/min
GFR calc non Af Amer: >60 mL/min
Glucose, Bld: 90 mg/dL (ref 65–99)
Potassium: 3.4 mmol/L — ABNORMAL LOW (ref 3.5–5.1)
Sodium: 133 mmol/L — ABNORMAL LOW (ref 135–145)

## 2016-05-28 MED ORDER — TERBUTALINE SULFATE 1 MG/ML IJ SOLN
INTRAMUSCULAR | Status: AC
  Administered 2016-05-28: 0.25 mg via SUBCUTANEOUS
  Filled 2016-05-28: qty 1

## 2016-05-28 MED ORDER — SODIUM CHLORIDE 0.9 % IV BOLUS (SEPSIS)
1000.0000 mL | Freq: Once | INTRAVENOUS | Status: AC
  Administered 2016-05-28: 1000 mL via INTRAVENOUS

## 2016-05-28 MED ORDER — ONDANSETRON HCL 4 MG/2ML IJ SOLN
INTRAMUSCULAR | Status: AC
  Filled 2016-05-28: qty 2

## 2016-05-28 MED ORDER — MORPHINE SULFATE (PF) 2 MG/ML IV SOLN
2.0000 mg | INTRAVENOUS | Status: DC | PRN
  Administered 2016-05-29 (×4): 2 mg via INTRAVENOUS
  Filled 2016-05-28 (×4): qty 1

## 2016-05-28 MED ORDER — PROMETHAZINE HCL 25 MG/ML IJ SOLN
25.0000 mg | Freq: Four times a day (QID) | INTRAMUSCULAR | Status: DC | PRN
Start: 2016-05-28 — End: 2016-05-29
  Administered 2016-05-28: 25 mg via INTRAVENOUS

## 2016-05-28 MED ORDER — PROMETHAZINE HCL 25 MG/ML IJ SOLN
INTRAMUSCULAR | Status: AC
Start: 2016-05-28 — End: 2016-05-28
  Administered 2016-05-28: 25 mg via INTRAVENOUS
  Filled 2016-05-28: qty 1

## 2016-05-28 MED ORDER — LACTATED RINGERS IV SOLN
INTRAVENOUS | Status: DC
  Administered 2016-05-28: 125 mL via INTRAVENOUS
  Administered 2016-05-29: 08:00:00 via INTRAVENOUS

## 2016-05-28 MED ORDER — MORPHINE SULFATE (PF) 4 MG/ML IV SOLN
INTRAVENOUS | Status: AC
  Filled 2016-05-28: qty 1

## 2016-05-28 MED ORDER — ONDANSETRON HCL 4 MG/2ML IJ SOLN
4.0000 mg | Freq: Once | INTRAMUSCULAR | Status: AC
  Administered 2016-05-28: 4 mg via INTRAVENOUS

## 2016-05-28 MED ORDER — MORPHINE SULFATE (PF) 4 MG/ML IV SOLN
4.0000 mg | Freq: Once | INTRAVENOUS | Status: AC
  Administered 2016-05-28: 4 mg via INTRAVENOUS

## 2016-05-28 MED ORDER — TERBUTALINE SULFATE 1 MG/ML IJ SOLN
0.2500 mg | Freq: Once | INTRAMUSCULAR | Status: AC
  Administered 2016-05-28: 0.25 mg via SUBCUTANEOUS

## 2016-05-28 MED ORDER — MORPHINE SULFATE (PF) 4 MG/ML IV SOLN
4.0000 mg | Freq: Once | INTRAVENOUS | Status: AC
Start: 2016-05-28 — End: 2016-05-28
  Administered 2016-05-28: 4 mg via INTRAVENOUS

## 2016-05-28 MED ORDER — SODIUM CHLORIDE 0.9 % IV SOLN
1.0000 g | Freq: Four times a day (QID) | INTRAVENOUS | Status: DC
  Administered 2016-05-28 – 2016-05-29 (×3): 1 g via INTRAVENOUS
  Filled 2016-05-28 (×3): qty 1000

## 2016-05-28 MED ORDER — SODIUM CHLORIDE FLUSH 0.9 % IV SOLN
INTRAVENOUS | Status: AC
  Administered 2016-05-28: 10 mL
  Filled 2016-05-28: qty 10

## 2016-05-28 MED ORDER — MORPHINE SULFATE (PF) 4 MG/ML IV SOLN
INTRAVENOUS | Status: AC
  Administered 2016-05-28: 4 mg via INTRAVENOUS
  Filled 2016-05-28: qty 1

## 2016-05-28 NOTE — ED Provider Notes (Signed)
[facility] Emergency Department Provider Note  ____________________________________________   None    (approximate)  I have reviewed the triage vital signs and the nursing notes.   HISTORY  Chief Complaint Abdominal Pain   HPI Madai Nuccio is a 26 y.o. female with a historyof second trimester miscarriage who is presenting at 19 weeks and 4 days at this time with lower abdominal cramping which started about an hour prior to arrival. She says that this feels like labor pains and contractions with severe pain about every 7 minutes and lasts 3-4 minutes. She denies any bleeding or gush of fluid. The patient is seen at the [facility] for prenatal care.   History reviewed. No pertinent past medical history.  Patient Active Problem List   Diagnosis Date Noted  . First trimester screening     Past Surgical History:  Procedure Laterality Date  . APPENDECTOMY    . CESAREAN SECTION    . CHOLECYSTECTOMY    . DILATION AND CURETTAGE OF UTERUS      Prior to Admission medications   Not on File    Allergies Patient has no known allergies.  History reviewed. No pertinent family history.  Social History Social History  Substance Use Topics  . Smoking status: Current Every Day Smoker    Packs/day: 1.00    Types: Cigarettes  . Smokeless tobacco: Never Used  . Alcohol use No    Review of Systems Constitutional: No fever/chills Eyes: No visual changes. ENT: No sore throat. Cardiovascular: Denies chest pain. Respiratory: Denies shortness of breath. Gastrointestinal:   No nausea, no vomiting.  No diarrhea.  No constipation. Genitourinary: Negative for dysuria. Musculoskeletal: as above Skin: Negative for rash. Neurological: Negative for headaches, focal weakness or numbness.  10-point ROS otherwise negative.  ____________________________________________   PHYSICAL EXAM:  VITAL SIGNS: ED Triage Vitals [05/28/16 2122]  Enc Vitals Group      BP      Pulse      Resp      Temp      Temp src      SpO2      Weight 158 lb (71.7 kg)     Height 5\' 4"  (1.626 m)     Head Circumference      Peak Flow      Pain Score 10     Pain Loc      Pain Edu?      Excl. in GC?     Constitutional: Alert and oriented. Patient appears uncomfortable with mild to moderate distress. Eyes: Conjunctivae are normal. PERRL. EOMI. Head: Atraumatic. Nose: No congestion/rhinnorhea. Mouth/Throat: Mucous membranes are moist.   Neck: No stridor.   Cardiovascular: Normal rate, regular rhythm. Grossly normal heart sounds.   Respiratory: Normal respiratory effort.  No retractions. Lungs CTAB. Gastrointestinal: Soft with tenderness to palpation across the lower abdomen. Gravid uterus palpable.. No distention.  Genitourinary: Normal external exam without any lesions or fluid leakage or bleeding. Bimanual exam with the cervix completely effaced. No dilation. Musculoskeletal: No lower extremity tenderness nor edema.  No joint effusions. Neurologic:  Normal speech and language. No gross focal neurologic deficits are appreciated. Skin:  Skin is warm, dry and intact. No rash noted. Psychiatric: Mood and affect are normal. Speech and behavior are normal.  ____________________________________________   LABS (all labs ordered are listed, but only abnormal results are displayed)  Labs Reviewed  CBC WITH DIFFERENTIAL/PLATELET - Abnormal; Notable for the following:  Result Value   WBC 17.7 (*)    Neutro Abs 15.5 (*)    All other components within normal limits  BASIC METABOLIC PANEL  URINALYSIS, COMPLETE (UACMP) WITH MICROSCOPIC  TYPE AND SCREEN   ____________________________________________  EKG   ____________________________________________  RADIOLOGY  Bedside point of care ultrasound done with fetus visualized with positive fetal movements. Heart rate of 178. ____________________________________________   PROCEDURES  Procedure(s)  performed:   Procedures  Critical Care performed:   ____________________________________________   INITIAL IMPRESSION / ASSESSMENT AND PLAN / ED COURSE  Pertinent labs & imaging results that were available during my care of the patient were reviewed by me and considered in my medical decision making (see chart for details).  ----------------------------------------- 10:36 PM on 05/28/2016 -----------------------------------------  I had discussed the case with Dr.Schermerhorn of the OB/GYN service. He agrees with accepting the patient to the labor and delivery floor. Patient initially with symptoms controlled with morphine but pain and contractions returned. Patient in moderate to severe distress. I also discussed the case with patient's mother who is aware of the plan. I got patient's permission to do this at the request of the mother. Patient received an additional dose of morphine. Also received Zofran. Vomiting 1 in the emergency department.      ____________________________________________   FINAL CLINICAL IMPRESSION(S) / ED DIAGNOSES  Preterm labor.    NEW MEDICATIONS STARTED DURING THIS VISIT:  New Prescriptions   No medications on file     Note:  This document was prepared using Dragon voice recognition software and may include unintentional dictation errors.    Myrna Blazeravid Matthew Tytiana Coles, MD 05/28/16 [id number]

## 2016-05-28 NOTE — ED Notes (Signed)
Pt states she is [redacted] weeks pregnant and having pelvic cramping, pain "like labor". Pt panting, obviously uncomfortable. Skin warm and dry.

## 2016-05-28 NOTE — ED Triage Notes (Signed)
Pt c/o low abd pain and low back pain worsening throughout the day; pt is 19 weeks 4 days pregnant; G5P2 with 2 miscarriages; pt says she feels like she needs to push; pt restless in triage with pursed lip breathing; tearful;

## 2016-05-28 NOTE — ED Notes (Signed)
Pt escorted to L&D by this RN and Judeth CornfieldStephanie, EDT. Report given to Santina Evansatherine, [place].

## 2016-05-28 NOTE — H&P (Signed)
Yvonne Shea is a 26 y.o. female [phone] at 19+4 week based on unsure LMP and a 7+6 week u/s on 03/07/16 ( 2 prior term c/s and a 8wk SAB + 13 week abruption )presenting for a one day h/o lower pelvic pressure and pelvic pain . Intermittent . No LOF , no vaginal bleeding. No recent intercourse or trauma.+ urine culture on 05/24/14 not treated. Seen in the ED and Yvonne Shea thought that her cx was 100%  effaced.without dilation  And sent to L+D.Last u/s on 05/24/16 : nl anatomy scan and cervical length =5.1 cm  OB History    Gravida Para Term Preterm AB Living   5         2   SAB TAB Ectopic Multiple Live Births                + antibody screen Anti-D_ too weak to titer( pt BT AB neg) History reviewed. No pertinent past medical history.  PMHX: Depression / anxiety ( seen in ED 01/2016) Cholelithiasis, GERD  Past Surgical History:  Procedure Laterality Date  . APPENDECTOMY    . CESAREAN SECTION    . CHOLECYSTECTOMY    . DILATION AND CURETTAGE OF UTERUS     Family History:HTN  Social History: 1.5 ppd tobacco use , no etoh . + drug screen benzodiazepines + MJ Allergies: sulfa    Maternal Diabetes: too early Genetic Screening:Too late first tri;  Declined second tri, neg CF , neg SMA Maternal Ultrasounds/Referrals: Normal dating above Fetal Ultrasounds or other Referrals:  None Maternal Substance Abuse:  Yes:  Type: Marijuana, Other: Benzodiazepines Significant Maternal Medications:  None Significant Maternal Lab Results:  None Other Comments:  None  ROS: psych: + depression and anxiety, GYN: +" abruption" 13 weeks ( no records ) History Blood pressure 109/64, pulse (!) 106, temperature 98.5 F (36.9 C), temperature source Oral, resp. rate 18, height 5\' 4"  (1.626 m), weight 158 lb (71.7 kg), SpO2 100 %. Exam   Physical Exam  Exam  Lungs CTA  CV RRR  Adb mild TTP lower , no rebound Pelvic / CX : closed / 40 % effaced / no blood  Labs: Results for orders placed or performed  during the hospital encounter of 05/28/16 (from the past 24 hour(s))  CBC with Differential     Status: Abnormal   Collection Time: 05/28/16  9:27 PM  Result Value Ref Range   WBC 17.7 (H) 3.6 - 11.0 K/uL   RBC 4.19 3.80 - 5.20 MIL/uL   Hemoglobin 12.9 12.0 - 16.0 g/dL   HCT 45.4 09.8 - 11.9 %   MCV 88.0 80.0 - 100.0 fL   MCH 30.7 26.0 - 34.0 pg   MCHC 34.9 32.0 - 36.0 g/dL   RDW 14.7 82.9 - 56.2 %   Platelets 200 150 - 440 K/uL   Neutrophils Relative % 87 %   Neutro Abs 15.5 (H) 1.4 - 6.5 K/uL   Lymphocytes Relative 7 %   Lymphs Abs 1.2 1.0 - 3.6 K/uL   Monocytes Relative 5 %   Monocytes Absolute 0.8 0.2 - 0.9 K/uL   Eosinophils Relative 1 %   Eosinophils Absolute 0.1 0 - 0.7 K/uL   Basophils Relative 0 %   Basophils Absolute 0.0 0 - 0.1 K/uL  Basic metabolic panel     Status: Abnormal   Collection Time: 05/28/16  9:27 PM  Result Value Ref Range   Sodium 133 (L) 135 - 145 mmol/L   Potassium  3.4 (L) 3.5 - 5.1 mmol/L   Chloride 104 101 - 111 mmol/L   CO2 20 (L) 22 - 32 mmol/L   Glucose, Bld 90 65 - 99 mg/dL   BUN 9 6 - 20 mg/dL   Creatinine, Ser 1.470.53 0.44 - 1.00 mg/dL   Calcium 9.0 8.9 - 82.910.3 mg/dL   GFR calc non Af Amer >60 >60 mL/min   GFR calc Af Amer >60 >60 mL/min   Anion gap 9 5 - 15  Type and screen [facility]     Status: None (Preliminary result)   Collection Time: 05/28/16 11:09 PM  Result Value Ref Range   ABO/RH(D) PENDING    Antibody Screen PENDING    Sample Expiration 05/31/2016    Prenatal labs: ABO, Rh: --/--/AB NEG (11/20 2255) Antibody: NEG (11/20 2255) Rubella:  imm varicella imm RPR:   neg HBsAg:  neg  HIV:   neg GBS:   + urine cx 05/24/16  Assessment/Plan: Acute onset of lower abdominal pain . Possible ctx . Elevated wbc . Differential dx : early placenta abruption , subclinical chorioamnionitis .No acute abdomen.  Pt is not a candidate for a cerclage with her symptoms c/w ctx . No evidence of cervical shortening   Untreated UTI  P: bed rest IVF  Sq terb  IVF and add ampicillin  Morphine prn pain  Repeat labs in am  Schedule u/s in am    Yvonne Shea 05/28/2016, 11:20 PM

## 2016-05-29 ENCOUNTER — Ambulatory Visit (HOSPITAL_COMMUNITY)
Admission: AD | Admit: 2016-05-29 | Discharge: 2016-05-29 | Disposition: A | Discharge status: Home / Self Care | Payer: Medicaid Other | Source: Other Acute Inpatient Hospital | Attending: Obstetrics & Gynecology | Admitting: Obstetrics & Gynecology

## 2016-05-29 ENCOUNTER — Encounter: Payer: Self-pay | Admitting: *Deleted

## 2016-05-29 DIAGNOSIS — O2690 Pregnancy related conditions, unspecified, unspecified trimester: Secondary | ICD-10-CM | POA: Diagnosis present

## 2016-05-29 LAB — URINE DRUG SCREEN, QUALITATIVE (ARMC ONLY)
Amphetamines, Ur Screen: NOT DETECTED
BARBITURATES, UR SCREEN: NOT DETECTED
Benzodiazepine, Ur Scrn: NOT DETECTED
CANNABINOID 50 NG, UR ~~LOC~~: POSITIVE — AB
Cocaine Metabolite,Ur ~~LOC~~: NOT DETECTED
MDMA (ECSTASY) UR SCREEN: NOT DETECTED
Methadone Scn, Ur: NOT DETECTED
Opiate, Ur Screen: POSITIVE — AB
Phencyclidine (PCP) Ur S: NOT DETECTED
TRICYCLIC, UR SCREEN: NOT DETECTED

## 2016-05-29 LAB — CBC
HCT: 29.8 % — ABNORMAL LOW (ref 35.0–47.0)
Hemoglobin: 10.3 g/dL — ABNORMAL LOW (ref 12.0–16.0)
MCH: 30.6 pg (ref 26.0–34.0)
MCHC: 34.6 g/dL (ref 32.0–36.0)
MCV: 88.4 fL (ref 80.0–100.0)
PLATELETS: 158 10*3/uL (ref 150–440)
RBC: 3.37 MIL/uL — ABNORMAL LOW (ref 3.80–5.20)
RDW: 14.1 % (ref 11.5–14.5)
WBC: 15.7 10*3/uL — ABNORMAL HIGH (ref 3.6–11.0)

## 2016-05-29 LAB — TYPE AND SCREEN
ABO/RH(D): AB NEG
ANTIBODY SCREEN: NEGATIVE

## 2016-05-29 LAB — URINALYSIS, COMPLETE (UACMP) WITH MICROSCOPIC
BILIRUBIN URINE: NEGATIVE
Bacteria, UA: NONE SEEN
Glucose, UA: NEGATIVE mg/dL
HGB URINE DIPSTICK: NEGATIVE
Ketones, ur: NEGATIVE mg/dL
LEUKOCYTES UA: NEGATIVE
NITRITE: NEGATIVE
PROTEIN: NEGATIVE mg/dL
SPECIFIC GRAVITY, URINE: 1.011 (ref 1.005–1.030)
pH: 6 (ref 5.0–8.0)

## 2016-05-29 LAB — LACTIC ACID, PLASMA
LACTIC ACID, VENOUS: 2.5 mmol/L — AB (ref 0.5–1.9)
Lactic Acid, Venous: 1.7 mmol/L (ref 0.5–1.9)

## 2016-05-29 LAB — CHLAMYDIA/NGC RT PCR (ARMC ONLY)
CHLAMYDIA TR: NOT DETECTED
N gonorrhoeae: NOT DETECTED

## 2016-05-29 MED ORDER — ACETAMINOPHEN 325 MG PO TABS
ORAL_TABLET | ORAL | Status: AC
  Administered 2016-05-29: 650 mg via ORAL
  Filled 2016-05-29: qty 2

## 2016-05-29 MED ORDER — GENTAMICIN SULFATE 40 MG/ML IJ SOLN
350.0000 mg | INTRAVENOUS | Status: DC
  Administered 2016-05-29: 350 mg via INTRAVENOUS
  Filled 2016-05-29: qty 8.75

## 2016-05-29 MED ORDER — ACETAMINOPHEN 325 MG PO TABS
650.0000 mg | ORAL_TABLET | ORAL | Status: DC | PRN
  Administered 2016-05-29: 650 mg via ORAL

## 2016-05-29 MED ORDER — NICOTINE 21 MG/24HR TD PT24
21.0000 mg | MEDICATED_PATCH | Freq: Every day | TRANSDERMAL | Status: DC
  Administered 2016-05-29: 21 mg via TRANSDERMAL
  Filled 2016-05-29: qty 1

## 2016-05-29 NOTE — Progress Notes (Signed)
MD discussed plan of care including transfer with patient. Pt is tearful but amenable to POC. Continues to request morphine for abdominal pain.

## 2016-05-29 NOTE — Discharge Summary (Signed)
Obstetric Discharge Summary Reason for Admission: abdominal pain, suspected chorioamnionitis Prenatal Procedures: ultrasound Intrapartum Procedures: MFM consult Postpartum Procedures: not delivered Complications-Operative and Postpartum: Suspected chorio with 19 week fetus Hemoglobin  Date Value Ref Range Status  05/29/2016 10.3 (L) 12.0 - 16.0 g/dL Final   HCT  Date Value Ref Range Status  05/29/2016 29.8 (L) 35.0 - 47.0 % Final   Care of this pt has been managed 100% by Dr Feliberto GottronSchermerhorn and Dr Elesa MassedWard. Asked to do a DC summary as ambulance was waiting.  Physical Exam:  General: alert, cooperative and appears stated age  Resp: reg and non-labored Uterus: IUP at 19 weeks  Urine: +GBS bacturia  Discharge Diagnoses: IUP at 19 weeks with suspected chorio  Discharge Information: Date: 05/29/2016 Activity: Bedres Diet: liquids till further notice Medications: None Condition: stable Instructions: Pt is being transferred via ambulance for higher level of care and consult [facility] [place]  Follow-up Information    Please follow up.   Contact information: You will be transferred directly to labor and delivery.          Newborn Data: This patient has no babies on file.  Yvonne Pimplearon W Shea 05/29/2016, 2:09 PM

## 2016-05-29 NOTE — OB Triage Note (Signed)
[id number] EDC 7.25.18 19.4 weeks pt [facility] to [facility] at approx 2240 from ED with complaint of contraction like pain, low abd pain radiating to her back. Pt moved from ED stretcher to Obs 1 bed for Triage.  Dr. Feliberto GottronSchermerhorn on unit and will see pt. Pt received one liter of fluids in ED and two doses of Morphine, Dose of zofran. FHtones were gotten there as well as US to determine fetal viability.  MD in ED did SVE and stated he thought pt is 100% effaced.  Pt has hx of 2 prior c/sections, one miscarriage and one abruption.  Allergy is to Sulfa medications which  Cause n/v.  Pt had a fall in early pregnancy and did hit floor with her bottom.  Pt had a UTI in February with min usage of medication per pt and SO.  C Ottie Tillery RNC

## 2016-05-29 NOTE — Progress Notes (Signed)
Yvonne Shea is a 26 y.o. G5P0 at [redacted]w[redacted]d with pelvic pain  Subjective: Overnight  Temp to 101.4 . Gentamycin 350 mg IV q 24 added  + Lactic acid 2.5  Still in pain  / Objective:  BP 121 /47most recent BP (!) 91/42   Pulse 88   Temp 98.4 F (36.9 C) (Oral)   Resp 18   Ht 5\' 4"  (1.626 m)   Wt 158 lb (71.7 kg)   SpO2 100%   BMI 27.12 kg/m  I/O last 3 completed shifts: In: 125 [I.V.:125] Out: -  No intake/output data recorded.  Abd + uterine TTP SVE:   Dilation: Closed Effacement (%): 40 Exam by:: TJS MD  TJS this am : closed / 50% Labs: Lab Results  Component Value Date   WBC 15.7 (H) 05/29/2016   HGB 10.3 (L) 05/29/2016   HCT 29.8 (L) 05/29/2016   MCV 88.4 05/29/2016   PLT 158 05/29/2016   Results for orders placed or performed during the hospital encounter of 05/28/16 (from the past 24 hour(s))  CBC with Differential     Status: Abnormal   Collection Time: 05/28/16  9:27 PM  Result Value Ref Range   WBC 17.7 (H) 3.6 - 11.0 K/uL   RBC 4.19 3.80 - 5.20 MIL/uL   Hemoglobin 12.9 12.0 - 16.0 g/dL   HCT 54.0 98.1 - 19.1 %   MCV 88.0 80.0 - 100.0 fL   MCH 30.7 26.0 - 34.0 pg   MCHC 34.9 32.0 - 36.0 g/dL   RDW 47.8 29.5 - 62.1 %   Platelets 200 150 - 440 K/uL   Neutrophils Relative % 87 %   Neutro Abs 15.5 (H) 1.4 - 6.5 K/uL   Lymphocytes Relative 7 %   Lymphs Abs 1.2 1.0 - 3.6 K/uL   Monocytes Relative 5 %   Monocytes Absolute 0.8 0.2 - 0.9 K/uL   Eosinophils Relative 1 %   Eosinophils Absolute 0.1 0 - 0.7 K/uL   Basophils Relative 0 %   Basophils Absolute 0.0 0 - 0.1 K/uL  Basic metabolic panel     Status: Abnormal   Collection Time: 05/28/16  9:27 PM  Result Value Ref Range   Sodium 133 (L) 135 - 145 mmol/L   Potassium 3.4 (L) 3.5 - 5.1 mmol/L   Chloride 104 101 - 111 mmol/L   CO2 20 (L) 22 - 32 mmol/L   Glucose, Bld 90 65 - 99 mg/dL   BUN 9 6 - 20 mg/dL   Creatinine, Ser 3.08 0.44 - 1.00 mg/dL   Calcium 9.0 8.9 - 65.7 mg/dL   GFR calc non Af Amer >60  >60 mL/min   GFR calc Af Amer >60 >60 mL/min   Anion gap 9 5 - 15  Type and screen [facility]     Status: None   Collection Time: 05/28/16 11:09 PM  Result Value Ref Range   ABO/RH(D) AB NEG    Antibody Screen NEG    Sample Expiration 05/31/2016   Urinalysis, Complete w Microscopic     Status: Abnormal   Collection Time: 05/28/16 11:32 PM  Result Value Ref Range   Color, Urine YELLOW (A) YELLOW   APPearance CLEAR (A) CLEAR   Specific Gravity, Urine 1.011 1.005 - 1.030   pH 6.0 5.0 - 8.0   Glucose, UA NEGATIVE NEGATIVE mg/dL   Hgb urine dipstick NEGATIVE NEGATIVE   Bilirubin Urine NEGATIVE NEGATIVE   Ketones, ur NEGATIVE NEGATIVE mg/dL   Protein,  ur NEGATIVE NEGATIVE mg/dL   Nitrite NEGATIVE NEGATIVE   Leukocytes, UA NEGATIVE NEGATIVE   RBC / HPF 0-5 0 - 5 RBC/hpf   WBC, UA 6-30 0 - 5 WBC/hpf   Bacteria, UA NONE SEEN NONE SEEN   Squamous Epithelial / LPF 0-5 (A) NONE SEEN   Mucous PRESENT   Urine Drug Screen, Qualitative ([facility] only)     Status: Abnormal   Collection Time: 05/28/16 11:32 PM  Result Value Ref Range   Tricyclic, Ur Screen NONE DETECTED NONE DETECTED   Amphetamines, Ur Screen NONE DETECTED NONE DETECTED   MDMA (Ecstasy)Ur Screen NONE DETECTED NONE DETECTED   Cocaine Metabolite,Ur [place] NONE DETECTED NONE DETECTED   Opiate, Ur Screen POSITIVE (A) NONE DETECTED   Phencyclidine (PCP) Ur S NONE DETECTED NONE DETECTED   Cannabinoid 50 Ng, Ur [place] POSITIVE (A) NONE DETECTED   Barbiturates, Ur Screen NONE DETECTED NONE DETECTED   Benzodiazepine, Ur Scrn NONE DETECTED NONE DETECTED   Methadone Scn, Ur NONE DETECTED NONE DETECTED  Chlamydia/NGC rt PCR ([facility] only)     Status: None   Collection Time: 05/28/16 11:32 PM  Result Value Ref Range   Specimen source GC/Chlam URINE, RANDOM    Chlamydia Tr NOT DETECTED NOT DETECTED   N gonorrhoeae NOT DETECTED NOT DETECTED  CBC     Status: Abnormal   Collection Time: 05/29/16  3:43 AM  Result Value Ref Range    WBC 15.7 (H) 3.6 - 11.0 K/uL   RBC 3.37 (L) 3.80 - 5.20 MIL/uL   Hemoglobin 10.3 (L) 12.0 - 16.0 g/dL   HCT 16.129.8 (L) 09.635.0 - 04.547.0 %   MCV 88.4 80.0 - 100.0 fL   MCH 30.6 26.0 - 34.0 pg   MCHC 34.6 32.0 - 36.0 g/dL   RDW 40.914.1 81.111.5 - 91.414.5 %   Platelets 158 150 - 440 K/uL  Lactic acid, plasma     Status: Abnormal   Collection Time: 05/29/16  3:43 AM  Result Value Ref Range   Lactic Acid, Venous 2.5 (HH) 0.5 - 1.9 mmol/L   Assessment / Plan: Fever , Uterine tenderness and elevated WBC all c/w intraamniotic infection  + Lactic acid with intermittent hypotensive episodes. I spoke to Dr Elesa MassedWard about having MFM perform an amniocentesis to confirm . If clinically the pt's condition worsen moving towards delivery without further eval is warranted given her exam .  Cont Ampicillin and Gent SCHERMERHORN,THOMAS 05/29/2016, 7:33 AM

## 2016-05-29 NOTE — Discharge Summary (Signed)
Antenatal Physician Discharge Summary  Patient ID: Yvonne ShieldsMariah Shea MRN: [id number] DOB/AGE: [birth year] 26 y.o.  Admit date: 05/28/2016 Discharge date: 05/29/2016  Admission Diagnoses: chorioamnionitis  Discharge Diagnoses: same  Prenatal Procedures: antibiotics  Consults:  Maternal Fetal Medicine  Significant Diagnostic Studies:  Results for orders placed or performed during the hospital encounter of 05/28/16 (from the past 168 hour(s))  CBC with Differential   Collection Time: 05/28/16  9:27 PM  Result Value Ref Range   WBC 17.7 (H) 3.6 - 11.0 K/uL   RBC 4.19 3.80 - 5.20 MIL/uL   Hemoglobin 12.9 12.0 - 16.0 g/dL   HCT 41.336.8 24.435.0 - 01.047.0 %   MCV 88.0 80.0 - 100.0 fL   MCH 30.7 26.0 - 34.0 pg   MCHC 34.9 32.0 - 36.0 g/dL   RDW 27.214.2 53.611.5 - 64.414.5 %   Platelets 200 150 - 440 K/uL   Neutrophils Relative % 87 %   Neutro Abs 15.5 (H) 1.4 - 6.5 K/uL   Lymphocytes Relative 7 %   Lymphs Abs 1.2 1.0 - 3.6 K/uL   Monocytes Relative 5 %   Monocytes Absolute 0.8 0.2 - 0.9 K/uL   Eosinophils Relative 1 %   Eosinophils Absolute 0.1 0 - 0.7 K/uL   Basophils Relative 0 %   Basophils Absolute 0.0 0 - 0.1 K/uL  Basic metabolic panel   Collection Time: 05/28/16  9:27 PM  Result Value Ref Range   Sodium 133 (L) 135 - 145 mmol/L   Potassium 3.4 (L) 3.5 - 5.1 mmol/L   Chloride 104 101 - 111 mmol/L   CO2 20 (L) 22 - 32 mmol/L   Glucose, Bld 90 65 - 99 mg/dL   BUN 9 6 - 20 mg/dL   Creatinine, Ser 0.340.53 0.44 - 1.00 mg/dL   Calcium 9.0 8.9 - 74.210.3 mg/dL   GFR calc non Af Amer >60 >60 mL/min   GFR calc Af Amer >60 >60 mL/min   Anion gap 9 5 - 15  Type and screen [facility]   Collection Time: 05/28/16 11:09 PM  Result Value Ref Range   ABO/RH(D) AB NEG    Antibody Screen NEG    Sample Expiration 05/31/2016   Chlamydia/NGC rt PCR ([facility] only)   Collection Time: 05/28/16 11:32 PM  Result Value Ref Range   Specimen source GC/Chlam URINE, RANDOM    Chlamydia Tr NOT DETECTED NOT  DETECTED   N gonorrhoeae NOT DETECTED NOT DETECTED  Urinalysis, Complete w Microscopic   Collection Time: 05/28/16 11:32 PM  Result Value Ref Range   Color, Urine YELLOW (A) YELLOW   APPearance CLEAR (A) CLEAR   Specific Gravity, Urine 1.011 1.005 - 1.030   pH 6.0 5.0 - 8.0   Glucose, UA NEGATIVE NEGATIVE mg/dL   Hgb urine dipstick NEGATIVE NEGATIVE   Bilirubin Urine NEGATIVE NEGATIVE   Ketones, ur NEGATIVE NEGATIVE mg/dL   Protein, ur NEGATIVE NEGATIVE mg/dL   Nitrite NEGATIVE NEGATIVE   Leukocytes, UA NEGATIVE NEGATIVE   RBC / HPF 0-5 0 - 5 RBC/hpf   WBC, UA 6-30 0 - 5 WBC/hpf   Bacteria, UA NONE SEEN NONE SEEN   Squamous Epithelial / LPF 0-5 (A) NONE SEEN   Mucous PRESENT   Urine Drug Screen, Qualitative ([facility] only)   Collection Time: 05/28/16 11:32 PM  Result Value Ref Range   Tricyclic, Ur Screen NONE DETECTED NONE DETECTED   Amphetamines, Ur Screen NONE DETECTED NONE DETECTED   MDMA (Ecstasy)Ur Screen NONE DETECTED  NONE DETECTED   Cocaine Metabolite,Ur [place] NONE DETECTED NONE DETECTED   Opiate, Ur Screen POSITIVE (A) NONE DETECTED   Phencyclidine (PCP) Ur S NONE DETECTED NONE DETECTED   Cannabinoid 50 Ng, Ur [place] POSITIVE (A) NONE DETECTED   Barbiturates, Ur Screen NONE DETECTED NONE DETECTED   Benzodiazepine, Ur Scrn NONE DETECTED NONE DETECTED   Methadone Scn, Ur NONE DETECTED NONE DETECTED  CBC   Collection Time: 05/29/16  3:43 AM  Result Value Ref Range   WBC 15.7 (H) 3.6 - 11.0 K/uL   RBC 3.37 (L) 3.80 - 5.20 MIL/uL   Hemoglobin 10.3 (L) 12.0 - 16.0 g/dL   HCT 16.1 (L) 09.6 - 04.5 %   MCV 88.4 80.0 - 100.0 fL   MCH 30.6 26.0 - 34.0 pg   MCHC 34.6 32.0 - 36.0 g/dL   RDW 40.9 81.1 - 91.4 %   Platelets 158 150 - 440 K/uL  Lactic acid, plasma   Collection Time: 05/29/16  3:43 AM  Result Value Ref Range   Lactic Acid, Venous 2.5 (HH) 0.5 - 1.9 mmol/L  Culture, blood (single)   Collection Time: 05/29/16  5:41 AM  Result Value Ref Range   Specimen Description  BLOOD RIGHT FOREARM    Special Requests BOTTLES DRAWN AEROBIC AND ANAEROBIC BCAV    Culture NO GROWTH <12 HOURS    Report Status PENDING   Lactic acid, plasma   Collection Time: 05/29/16 11:00 AM  Result Value Ref Range   Lactic Acid, Venous 1.7 0.5 - 1.9 mmol/L    Treatments: Ampicillin, gentamicin  Hospital Course:  This is a 26 y.o. G5P2 with IUP at [redacted]w[redacted]d admitted for SIRS, febrile illness, persistent contractions, noted to have a cervical exam of 0/40/high.  No leaking of fluid and no bleeding.  She was started on antibiotics.  Concern was for pre-viable chorioamnionitis, with physical exam findings consistent with chorio.  Urine culture from 2/28 showed GBS with 50-100k colonies, which was previously untreated.  She was given morphine for pain/pressure.  Call placed to MFM to determine whether amniocentesis was an appropriate next step, or to wait for resolution vs induction.  It was recommended that she be transferred to [facility] for further care.  Discharge Physical Exam: BP (!) 109/54   Pulse (!) 105   Temp 98.1 F (36.7 C)   Resp 18   Ht 5\' 4"  (1.626 m)   Wt 71.7 kg (158 lb)   SpO2 100%   BMI 27.12 kg/m   General: NAD CV: RRR Pulm: CTABL, nl effort ABD: tender to palpation, gravid DVT Evaluation: LE non-ttp, no evidence of DVT on exam.  FHR 150s   Discharge Condition: Stable, Guarded  Disposition: transfer to [facility] L&D Triage, Dr. Dennie Maizes    Signed: ----- [name], MD Attending Obstetrician and Gynecologist [facility]

## 2016-05-29 NOTE — Discharge Summary (Signed)
[facility] Upon d/c pt did not c/o pain or discomfort, VSS. IV access not removed. +FM. Last FHR 147. [facility] provided d/c summary EMTALA, and medical necessity form. [facility] [phone] Report called to Lauren.

## 2016-06-03 LAB — CULTURE, BLOOD (SINGLE): Culture: NO GROWTH

## 2016-06-10 ENCOUNTER — Inpatient Hospital Stay
Admission: EM | Admit: 2016-06-10 | Discharge: 2016-06-14 | DRG: 781 | Disposition: A | Discharge status: Home / Self Care | Payer: Medicaid Other | Attending: Obstetrics and Gynecology | Admitting: Obstetrics and Gynecology

## 2016-06-10 DIAGNOSIS — Z8249 Family history of ischemic heart disease and other diseases of the circulatory system: Secondary | ICD-10-CM

## 2016-06-10 DIAGNOSIS — F17210 Nicotine dependence, cigarettes, uncomplicated: Secondary | ICD-10-CM | POA: Diagnosis present

## 2016-06-10 DIAGNOSIS — O2342 Unspecified infection of urinary tract in pregnancy, second trimester: Principal | ICD-10-CM | POA: Diagnosis present

## 2016-06-10 DIAGNOSIS — Z1624 Resistance to multiple antibiotics: Secondary | ICD-10-CM | POA: Diagnosis present

## 2016-06-10 DIAGNOSIS — O99820 Streptococcus B carrier state complicating pregnancy: Secondary | ICD-10-CM | POA: Diagnosis present

## 2016-06-10 DIAGNOSIS — B9620 Unspecified Escherichia coli [E. coli] as the cause of diseases classified elsewhere: Secondary | ICD-10-CM | POA: Diagnosis present

## 2016-06-10 DIAGNOSIS — F330 Major depressive disorder, recurrent, mild: Secondary | ICD-10-CM

## 2016-06-10 DIAGNOSIS — O99332 Smoking (tobacco) complicating pregnancy, second trimester: Secondary | ICD-10-CM | POA: Diagnosis present

## 2016-06-10 DIAGNOSIS — K219 Gastro-esophageal reflux disease without esophagitis: Secondary | ICD-10-CM | POA: Diagnosis present

## 2016-06-10 DIAGNOSIS — Z3A21 21 weeks gestation of pregnancy: Secondary | ICD-10-CM

## 2016-06-10 DIAGNOSIS — Z833 Family history of diabetes mellitus: Secondary | ICD-10-CM

## 2016-06-10 DIAGNOSIS — F339 Major depressive disorder, recurrent, unspecified: Secondary | ICD-10-CM | POA: Diagnosis present

## 2016-06-10 DIAGNOSIS — F410 Panic disorder [episodic paroxysmal anxiety] without agoraphobia: Secondary | ICD-10-CM

## 2016-06-10 DIAGNOSIS — O99612 Diseases of the digestive system complicating pregnancy, second trimester: Secondary | ICD-10-CM | POA: Diagnosis present

## 2016-06-10 DIAGNOSIS — O99342 Other mental disorders complicating pregnancy, second trimester: Secondary | ICD-10-CM | POA: Diagnosis present

## 2016-06-11 DIAGNOSIS — F17210 Nicotine dependence, cigarettes, uncomplicated: Secondary | ICD-10-CM | POA: Diagnosis present

## 2016-06-11 DIAGNOSIS — F330 Major depressive disorder, recurrent, mild: Secondary | ICD-10-CM

## 2016-06-11 DIAGNOSIS — Z3A21 21 weeks gestation of pregnancy: Secondary | ICD-10-CM | POA: Diagnosis not present

## 2016-06-11 DIAGNOSIS — K219 Gastro-esophageal reflux disease without esophagitis: Secondary | ICD-10-CM | POA: Diagnosis present

## 2016-06-11 DIAGNOSIS — O2342 Unspecified infection of urinary tract in pregnancy, second trimester: Secondary | ICD-10-CM | POA: Diagnosis present

## 2016-06-11 DIAGNOSIS — F339 Major depressive disorder, recurrent, unspecified: Secondary | ICD-10-CM | POA: Diagnosis present

## 2016-06-11 DIAGNOSIS — O99612 Diseases of the digestive system complicating pregnancy, second trimester: Secondary | ICD-10-CM | POA: Diagnosis present

## 2016-06-11 DIAGNOSIS — Z8249 Family history of ischemic heart disease and other diseases of the circulatory system: Secondary | ICD-10-CM | POA: Diagnosis not present

## 2016-06-11 DIAGNOSIS — F410 Panic disorder [episodic paroxysmal anxiety] without agoraphobia: Secondary | ICD-10-CM | POA: Diagnosis present

## 2016-06-11 DIAGNOSIS — Z1624 Resistance to multiple antibiotics: Secondary | ICD-10-CM | POA: Diagnosis present

## 2016-06-11 DIAGNOSIS — O99342 Other mental disorders complicating pregnancy, second trimester: Secondary | ICD-10-CM | POA: Diagnosis present

## 2016-06-11 DIAGNOSIS — O99820 Streptococcus B carrier state complicating pregnancy: Secondary | ICD-10-CM | POA: Diagnosis present

## 2016-06-11 DIAGNOSIS — Z833 Family history of diabetes mellitus: Secondary | ICD-10-CM | POA: Diagnosis not present

## 2016-06-11 DIAGNOSIS — O99332 Smoking (tobacco) complicating pregnancy, second trimester: Secondary | ICD-10-CM | POA: Diagnosis present

## 2016-06-11 DIAGNOSIS — B9620 Unspecified Escherichia coli [E. coli] as the cause of diseases classified elsewhere: Secondary | ICD-10-CM | POA: Diagnosis present

## 2016-06-11 DIAGNOSIS — R1030 Lower abdominal pain, unspecified: Secondary | ICD-10-CM | POA: Diagnosis present

## 2016-06-11 LAB — COMPREHENSIVE METABOLIC PANEL
ALT: 10 U/L — ABNORMAL LOW (ref 14–54)
ANION GAP: 7 (ref 5–15)
AST: 12 U/L — ABNORMAL LOW (ref 15–41)
Albumin: 3 g/dL — ABNORMAL LOW (ref 3.5–5.0)
Alkaline Phosphatase: 92 U/L (ref 38–126)
BUN: 8 mg/dL (ref 6–20)
CALCIUM: 8.1 mg/dL — AB (ref 8.9–10.3)
CHLORIDE: 103 mmol/L (ref 101–111)
CO2: 22 mmol/L (ref 22–32)
Creatinine, Ser: 0.46 mg/dL (ref 0.44–1.00)
Glucose, Bld: 91 mg/dL (ref 65–99)
Potassium: 3.2 mmol/L — ABNORMAL LOW (ref 3.5–5.1)
SODIUM: 132 mmol/L — AB (ref 135–145)
Total Bilirubin: 0.8 mg/dL (ref 0.3–1.2)
Total Protein: 6.6 g/dL (ref 6.5–8.1)

## 2016-06-11 LAB — URINALYSIS, ROUTINE W REFLEX MICROSCOPIC
BILIRUBIN URINE: NEGATIVE
GLUCOSE, UA: NEGATIVE mg/dL
HGB URINE DIPSTICK: NEGATIVE
Ketones, ur: 20 mg/dL — AB
NITRITE: POSITIVE — AB
PROTEIN: 100 mg/dL — AB
Specific Gravity, Urine: 1.015 (ref 1.005–1.030)
pH: 6 (ref 5.0–8.0)

## 2016-06-11 LAB — URINE DRUG SCREEN, QUALITATIVE (ARMC ONLY)
Amphetamines, Ur Screen: NOT DETECTED
Barbiturates, Ur Screen: NOT DETECTED
Benzodiazepine, Ur Scrn: NOT DETECTED
CANNABINOID 50 NG, UR ~~LOC~~: NOT DETECTED
Cocaine Metabolite,Ur ~~LOC~~: NOT DETECTED
MDMA (Ecstasy)Ur Screen: NOT DETECTED
Methadone Scn, Ur: NOT DETECTED
Opiate, Ur Screen: NOT DETECTED
PHENCYCLIDINE (PCP) UR S: NOT DETECTED
TRICYCLIC, UR SCREEN: NOT DETECTED

## 2016-06-11 LAB — CBC
HCT: 30.5 % — ABNORMAL LOW (ref 35.0–47.0)
Hemoglobin: 10.9 g/dL — ABNORMAL LOW (ref 12.0–16.0)
MCH: 31.3 pg (ref 26.0–34.0)
MCHC: 35.7 g/dL (ref 32.0–36.0)
MCV: 87.5 fL (ref 80.0–100.0)
PLATELETS: 189 10*3/uL (ref 150–440)
RBC: 3.49 MIL/uL — ABNORMAL LOW (ref 3.80–5.20)
RDW: 14.4 % (ref 11.5–14.5)
WBC: 14.4 10*3/uL — ABNORMAL HIGH (ref 3.6–11.0)

## 2016-06-11 MED ORDER — DOCUSATE SODIUM 100 MG PO CAPS
100.0000 mg | ORAL_CAPSULE | Freq: Every day | ORAL | Status: DC
  Administered 2016-06-12 – 2016-06-14 (×3): 100 mg via ORAL
  Filled 2016-06-11 (×3): qty 1

## 2016-06-11 MED ORDER — ONDANSETRON HCL 4 MG/2ML IJ SOLN
4.0000 mg | Freq: Four times a day (QID) | INTRAMUSCULAR | Status: DC | PRN
  Administered 2016-06-11 – 2016-06-13 (×6): 4 mg via INTRAVENOUS
  Filled 2016-06-11 (×6): qty 2

## 2016-06-11 MED ORDER — LACTATED RINGERS IV BOLUS (SEPSIS)
500.0000 mL | Freq: Once | INTRAVENOUS | Status: AC
  Administered 2016-06-11: 1000 mL via INTRAVENOUS

## 2016-06-11 MED ORDER — GENTAMICIN SULFATE 40 MG/ML IJ SOLN: 300.0000 mg | INTRAVENOUS | Status: DC

## 2016-06-11 MED ORDER — ACETAMINOPHEN 325 MG PO TABS
650.0000 mg | ORAL_TABLET | ORAL | Status: DC | PRN
  Administered 2016-06-12 (×2): 650 mg via ORAL
  Filled 2016-06-11 (×2): qty 2

## 2016-06-11 MED ORDER — PIPERACILLIN-TAZOBACTAM 3.375 G IVPB
3.3750 g | Freq: Three times a day (TID) | INTRAVENOUS | Status: DC
  Administered 2016-06-11 – 2016-06-12 (×3): 3.375 g via INTRAVENOUS
  Filled 2016-06-11 (×7): qty 50

## 2016-06-11 MED ORDER — ZOLPIDEM TARTRATE 5 MG PO TABS
5.0000 mg | ORAL_TABLET | Freq: Every evening | ORAL | Status: DC | PRN
  Administered 2016-06-11 – 2016-06-13 (×4): 5 mg via ORAL
  Filled 2016-06-11 (×5): qty 1

## 2016-06-11 MED ORDER — SODIUM CHLORIDE 0.9 % IV SOLN
INTRAVENOUS | Status: DC
  Administered 2016-06-11 – 2016-06-13 (×5): via INTRAVENOUS

## 2016-06-11 MED ORDER — PRENATAL MULTIVITAMIN CH
1.0000 | ORAL_TABLET | Freq: Every day | ORAL | Status: DC
  Filled 2016-06-11: qty 1

## 2016-06-11 MED ORDER — GENTAMICIN SULFATE 40 MG/ML IJ SOLN
300.0000 mg | INTRAVENOUS | Status: DC
  Administered 2016-06-11: 300 mg via INTRAVENOUS
  Filled 2016-06-11: qty 7.5

## 2016-06-11 MED ORDER — POTASSIUM CHLORIDE CRYS ER 20 MEQ PO TBCR
20.0000 meq | EXTENDED_RELEASE_TABLET | Freq: Two times a day (BID) | ORAL | Status: DC
  Administered 2016-06-11 – 2016-06-14 (×7): 20 meq via ORAL
  Filled 2016-06-11 (×8): qty 1

## 2016-06-11 MED ORDER — CLINDAMYCIN PHOSPHATE 900 MG/50ML IV SOLN
900.0000 mg | Freq: Three times a day (TID) | INTRAVENOUS | Status: DC
  Administered 2016-06-11: 900 mg via INTRAVENOUS
  Filled 2016-06-11: qty 50

## 2016-06-11 MED ORDER — CLINDAMYCIN PHOSPHATE 900 MG/50ML IV SOLN
900.0000 mg | Freq: Three times a day (TID) | INTRAVENOUS | Status: DC
  Administered 2016-06-11: 900 mg via INTRAVENOUS
  Filled 2016-06-11 (×2): qty 50

## 2016-06-11 MED ORDER — CALCIUM CARBONATE ANTACID 500 MG PO CHEW: 2.0000 | CHEWABLE_TABLET | ORAL | Status: DC | PRN

## 2016-06-11 MED ORDER — LACTATED RINGERS IV SOLN
INTRAVENOUS | Status: DC
Start: 2016-06-11 — End: 2016-06-11

## 2016-06-11 MED ORDER — SERTRALINE HCL 25 MG PO TABS
25.0000 mg | ORAL_TABLET | Freq: Every day | ORAL | Status: DC
  Administered 2016-06-11 – 2016-06-14 (×4): 25 mg via ORAL
  Filled 2016-06-11 (×4): qty 1

## 2016-06-11 NOTE — Plan of Care (Signed)
Problem: Physical Regulation: Goal: Will remain free from infection Outcome: Not Progressing Long Standing UTI

## 2016-06-11 NOTE — Progress Notes (Signed)
Pharmacy Antibiotic Note  Lynnda ShieldsMariah Shea is a 26 y.o. female admitted on 06/10/2016 with chorioamniotitis.  Pharmacy has been consulted for gentamicin dosing.  Plan: Will give gentamicin 300 mg daily (5 mg/kg) extended interval dosing  Will draw a gentamicin trough 3/20 @ 0200 prior to 3rd dose to ensure undetectable levels. Goal level < 1- 2 mcg/mL     Temp (24hrs), Avg:98.7 F (37.1 C), Min:98.7 F (37.1 C), Max:98.7 F (37.1 C)   Recent Labs Lab 06/11/16 0228  WBC 14.4*    Estimated Creatinine Clearance: 104.4 mL/min (by C-G formula based on SCr of 0.53 mg/dL).    Allergies  Allergen Reactions  . Sulfa Antibiotics Nausea Only and Nausea And Vomiting    Thank you for allowing pharmacy to be a part of this patient's care.  Thomasene Rippleavid Riya Huxford, PharmD, BCPS Clinical Pharmacist 06/11/2016

## 2016-06-11 NOTE — Discharge Instructions (Signed)

## 2016-06-11 NOTE — H&P (Signed)
Yvonne ShieldsMariah Shea is a 26 y.o. female presenting for lower abd pain and flank pain. [facility] for LMP of unknown timeframe  and EDD of 10/18/16 based on an US at 7 6/7 weeks,    [facility] for poss chorioamnionitis and UTI and [facility]. Pt was given IV antibiotics and released and [facility]. Pt missing her antibiotics and again had UTI. Pt also had loose BM's, vomiting, feeling pain in the flank areas.  ZOX:[id number]:Anxiety, UTI, kidney calculus, depression, GERD, Pt admits to white stools since childhood.  OB History    Gravida Para Term Preterm AB Living   5 2       2    SAB TAB Ectopic Multiple Live Births           1    5 Current pregnancy 4 Term  02/07/14  39 weeks Girl LTCS 3 Term 10/21/09 40 weeks Girl LTCS 2 SAB 1SAB  Past Surgical History:  Procedure Laterality Date  . APPENDECTOMY    . CESAREAN SECTION    . CHOLECYSTECTOMY    . DILATION AND CURETTAGE OF UTERUS     Family History:Mom:HTN, UJW:JXBJYad:heart failure and cancer, NWG:NFAOGM:Type 2 DM, ZHY:QMVHQIom:recent pregnancy loss Social History:  reports that she has been smoking Cigarettes.  She has been smoking about 1.00 pack per day. She has never used smokeless tobacco. She reports that she does not drink alcohol or use drugs. Pt has smoked up to 1.5 ppd. 26 yo Biracial FOB in good health.  Allergic: Sulfa    Maternal Diabetes: no hx and will be tested at 28 weeks Genetic Screening: Maternal Ultrasounds/Referrals: Fetal Ultrasounds or other Referrals:   Maternal Substance Abuse:   Significant Maternal Medications:   Significant Maternal Lab Results:    Review of Systems  Constitutional: Negative.   HENT: Negative.   Eyes: Negative.   Respiratory: Negative.   Cardiovascular: Negative.   Gastrointestinal: Negative.   Genitourinary: Positive for dysuria and flank pain.  Musculoskeletal: Positive for back pain.  Skin: Negative.   Neurological: Negative.    Endo/Heme/Allergies: Negative.   Psychiatric/Behavioral: Positive for depression. The patient is nervous/anxious.    History   Blood pressure (!) 94/50, pulse (!) 108, temperature 98.7 F (37.1 C), temperature source Oral. Exam Physical Exam  Gen: 26 yo white female in mod discomfort HEENT: Normocephalic, Eyes non-icteric Heart: S1S2, RRR, No M/R/G Lungs: CTA bilat, no W./R/R Abd: Sl tender to suprapubic area, Gravid CVAT: TTP over bilat areas Extrems: 2+/0 reflexes Prenatal labs: ABO, Rh: --/--/AB NEG (03/04 2309) Antibody: NEG (03/04 2309) Rubella:  Immune RPR:   NR HBsAg:   Neg HIV:   Neg GBS:   GBS bacturia Varicella: Immune Assessment/Plan: A: 1. IUP at 21 4/7 weeks 2. UTI 3. GBS bacturia P: IV hydration and antibiotics 2. Stool for O&P 3. Psych evaluation for depression and anxiety 4. CBC and CMP  Sharee PimpleCaron W Jones 06/11/2016, 2:41 AM

## 2016-06-11 NOTE — Consult Note (Signed)
[facility] Consult   Reason for Consult:  Consult for 26 year old woman with a history of at least several weeks of anxiety. Currently in the hospital with persistent urinary tract infection during pregnancy Referring Physician:  Ronnald Ramp [name] MRN:  [id number] Principal Diagnosis: Panic disorder Diagnosis:   Patient Active Problem List   Diagnosis Date Noted  . UTI (urinary tract infection) during pregnancy, second trimester [O23.42] 06/11/2016  . Panic disorder [F41.0] 06/11/2016  . 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 Uterine contractions during pregnancy [O62.2] 05/28/2016  . First trimester screening [Z36.9]     Total Time spent with patient: 1 hour  Subjective:   Yvonne Shea is a 26 y.o. female patient admitted with "my nerves of been bad".  HPI:  Patient interviewed. Chart reviewed. 26 year old woman who is currently on the mother baby unit. About [redacted] weeks pregnant. Says she is here for persistent urinary tract infections. Concern raised about anxiety and depression. Patient says her mood and anxiety has been bad for several weeks probably at least since she learned she was pregnant. About 3 times a week she will get anxiety attacks. She will get overwhelmed with nervousness and feel like she is dissociating and things are all moving around her chaotically. Gets dizzy. Mostly these things happen in crowded social situations but not entirely. Overall her mood is a little bit more down which has been a chronic thing but worse. She feels like she is under a lot of stress. Stress from having her to older children not in her current custody. Stress from her grandfather not speaking to her as well as the current pregnancy. Sleep is impaired only a couple hours a night usually. Appetite a little bit less than usual. Denies any suicidal or homicidal thoughts but has periods of feeling hopeless. No psychotic symptoms. Not  currently taking any psychiatric medicine. Denies that she's using any alcohol or drugs.  Medical history: Patient has a 22 week pregnancy and recurrent urinary tract infections. Denies other ongoing medical problems.  Social history: Lives with her boyfriend. Not currently working. Has 2 children already who live with their father which is a sore point with the patient. She doesn't get to see them much. Her grandfather is her closest relative in the area and he is currently not speaking to her for some reason which is a big stress for her. Her Payton Mccallum  Substance abuse history: Denies any current or past alcohol or drug abuse  Past Psychiatric History: Patient has been to the emergency room with anxiety symptoms before but eventually left without being seen by the psychiatrist. Has never been prescribed any psychiatric medicine in the past. No psychiatric hospitalization. She does report one suicide attempt at age 82 by cutting her arm. She was frustrated at the time. No further suicide attempts. Did not develop a habit of cutting. Street of mania or psychosis.  Risk to Self: Is patient at risk for suicide?: No Risk to Others:   Prior Inpatient Therapy:   Prior Outpatient Therapy:    Past Medical History: History reviewed. No pertinent past medical history.  Past Surgical History:  Procedure Laterality Date  . APPENDECTOMY    . CESAREAN SECTION    . CHOLECYSTECTOMY    . DILATION AND CURETTAGE OF UTERUS     Family History: History reviewed. No pertinent family history. Family Psychiatric  History: She is not aware of any significant family history of mental illness or substance abuse Social  History:  History  Alcohol Use No     History  Drug Use No    Social History   Social History  . Marital status: Single    Spouse name: N/A  . Number of children: N/A  . Years of education: N/A   Social History Main Topics  . Smoking status: Current Every Day Smoker    Packs/day: 1.00     Types: Cigarettes  . Smokeless tobacco: Never Used  . Alcohol use No  . Drug use: No  . Sexual activity: Yes   Other Topics Concern  . None   Social History Narrative  . None   Additional Social History:    Allergies:   Allergies  Allergen Reactions  . Sulfa Antibiotics Nausea Only and Nausea And Vomiting    Labs:  Results for orders placed or performed during the hospital encounter of 06/10/16 (from the past 48 hour(s))  Urinalysis, Routine w reflex microscopic     Status: Abnormal   Collection Time: 06/11/16 12:42 AM  Result Value Ref Range   Color, Urine YELLOW (A) YELLOW   APPearance CLOUDY (A) CLEAR   Specific Gravity, Urine 1.015 1.005 - 1.030   pH 6.0 5.0 - 8.0   Glucose, UA NEGATIVE NEGATIVE mg/dL   Hgb urine dipstick NEGATIVE NEGATIVE   Bilirubin Urine NEGATIVE NEGATIVE   Ketones, ur 20 (A) NEGATIVE mg/dL   Protein, ur 100 (A) NEGATIVE mg/dL   Nitrite POSITIVE (A) NEGATIVE   Leukocytes, UA LARGE (A) NEGATIVE   RBC / HPF 6-30 0 - 5 RBC/hpf   WBC, UA TOO NUMEROUS TO COUNT 0 - 5 WBC/hpf   Bacteria, UA MANY (A) NONE SEEN   Squamous Epithelial / LPF 0-5 (A) NONE SEEN   WBC Clumps PRESENT    Mucous PRESENT   Urine Drug Screen, Qualitative ([facility] only)     Status: None   Collection Time: 06/11/16 12:42 AM  Result Value Ref Range   Tricyclic, Ur Screen NONE DETECTED NONE DETECTED   Amphetamines, Ur Screen NONE DETECTED NONE DETECTED   MDMA (Ecstasy)Ur Screen NONE DETECTED NONE DETECTED   Cocaine Metabolite,Ur [place] NONE DETECTED NONE DETECTED   Opiate, Ur Screen NONE DETECTED NONE DETECTED   Phencyclidine (PCP) Ur S NONE DETECTED NONE DETECTED   Cannabinoid 50 Ng, Ur  NONE DETECTED NONE DETECTED   Barbiturates, Ur Screen NONE DETECTED NONE DETECTED   Benzodiazepine, Ur Scrn NONE DETECTED NONE DETECTED   Methadone Scn, Ur NONE DETECTED NONE DETECTED    Comment: (NOTE) 956  Tricyclics, urine               Cutoff 1000 ng/mL 200  Amphetamines, urine              Cutoff 1000 ng/mL 300  MDMA (Ecstasy), urine           Cutoff 500 ng/mL 400  Cocaine Metabolite, urine       Cutoff 300 ng/mL 500  Opiate, urine                   Cutoff 300 ng/mL 600  Phencyclidine (PCP), urine      Cutoff 25 ng/mL 700  Cannabinoid, urine              Cutoff 50 ng/mL 800  Barbiturates, urine             Cutoff 200 ng/mL 900  Benzodiazepine, urine           Cutoff 200 ng/mL  1000 Methadone, urine                Cutoff 300 ng/mL 1100 1200 The urine drug screen provides only a preliminary, unconfirmed 1300 analytical test result and should not be used for non-medical 1400 purposes. Clinical consideration and professional judgment should 1500 be applied to any positive drug screen result due to possible 1600 interfering substances. A more specific alternate chemical method 1700 must be used in order to obtain a confirmed analytical result.  1800 Gas chromato graphy / mass spectrometry (GC/MS) is the preferred 1900 confirmatory method.   CBC     Status: Abnormal   Collection Time: 06/11/16  2:28 AM  Result Value Ref Range   WBC 14.4 (H) 3.6 - 11.0 K/uL   RBC 3.49 (L) 3.80 - 5.20 MIL/uL   Hemoglobin 10.9 (L) 12.0 - 16.0 g/dL   HCT 30.5 (L) 35.0 - 47.0 %   MCV 87.5 80.0 - 100.0 fL   MCH 31.3 26.0 - 34.0 pg   MCHC 35.7 32.0 - 36.0 g/dL   RDW 14.4 11.5 - 14.5 %   Platelets 189 150 - 440 K/uL  Comprehensive metabolic panel     Status: Abnormal   Collection Time: 06/11/16  2:28 AM  Result Value Ref Range   Sodium 132 (L) 135 - 145 mmol/L   Potassium 3.2 (L) 3.5 - 5.1 mmol/L   Chloride 103 101 - 111 mmol/L   CO2 22 22 - 32 mmol/L   Glucose, Bld 91 65 - 99 mg/dL   BUN 8 6 - 20 mg/dL   Creatinine, Ser 0.46 0.44 - 1.00 mg/dL   Calcium 8.1 (L) 8.9 - 10.3 mg/dL   Total Protein 6.6 6.5 - 8.1 g/dL   Albumin 3.0 (L) 3.5 - 5.0 g/dL   AST 12 (L) 15 - 41 U/L   ALT 10 (L) 14 - 54 U/L   Alkaline Phosphatase 92 38 - 126 U/L   Total Bilirubin 0.8 0.3 - 1.2 mg/dL   GFR calc non  Af Amer >60 >60 mL/min   GFR calc Af Amer >60 >60 mL/min    Comment: (NOTE) The eGFR has been calculated using the CKD EPI equation. This calculation has not been validated in all clinical situations. eGFR's persistently <60 mL/min signify possible Chronic Kidney Disease.    Anion gap 7 5 - 15    Current Facility-Administered Medications  Medication Dose Route Frequency Provider Last Rate Last Dose  . 0.9 %  sodium chloride infusion   Intravenous Continuous Catheryn Bacon, CNM 125 mL/hr at 06/11/16 0425    . acetaminophen (TYLENOL) tablet 650 mg  650 mg Oral Q4H PRN Catheryn Bacon, CNM      . calcium carbonate (TUMS - dosed in mg elemental calcium) chewable tablet 400 mg of elemental calcium  2 tablet Oral Q4H PRN Catheryn Bacon, CNM      . docusate sodium (COLACE) capsule 100 mg  100 mg Oral Daily Catheryn Bacon, CNM      . ondansetron (ZOFRAN) injection 4 mg  4 mg Intravenous Q6H PRN Catheryn Bacon, CNM   4 mg at 06/11/16 2694  . piperacillin-tazobactam (ZOSYN) IVPB 3.375 g  3.375 g Intravenous Q8H Catheryn Bacon, CNM   3.375 g at 06/11/16 1301  . potassium chloride SA (K-DUR,KLOR-CON) CR tablet 20 mEq  20 mEq Oral BID Catheryn Bacon, CNM   20 mEq at 06/11/16 0425  . prenatal multivitamin tablet 1 tablet  1 tablet Oral Q1200 Catheryn Bacon, CNM      . zolpidem (AMBIEN) tablet 5 mg  5 mg Oral QHS PRN Catheryn Bacon, CNM   5 mg at 06/11/16 0425    Musculoskeletal: Strength & Muscle Tone: within normal limits Gait & Station: normal Patient leans: N/A  Psychiatric Specialty Exam: Physical Exam  Nursing note and vitals reviewed. Constitutional: She appears well-developed and well-nourished.  HENT:  Head: Normocephalic and atraumatic.  Eyes: Conjunctivae are normal. Pupils are equal, round, and reactive to light.  Neck: Normal range of motion.  Cardiovascular: Regular rhythm and normal heart sounds.   Respiratory: Effort normal. No respiratory distress.  GI: Soft.    Musculoskeletal:  Normal range of motion.  Neurological: She is alert.  Skin: Skin is warm and dry.  Psychiatric: Her speech is normal. Judgment normal. Her mood appears anxious. She is slowed. Thought content is not paranoid. Cognition and memory are normal. She exhibits a depressed mood. She expresses no homicidal and no suicidal ideation.    Review of Systems  Constitutional: Negative.   HENT: Negative.   Eyes: Negative.   Respiratory: Negative.   Cardiovascular: Negative.   Gastrointestinal: Positive for nausea.  Musculoskeletal: Negative.   Skin: Negative.   Neurological: Negative.   Psychiatric/Behavioral: Positive for depression. Negative for hallucinations, memory loss, substance abuse and suicidal ideas. The patient is nervous/anxious and has insomnia.     Blood pressure (!) 91/46, pulse 89, temperature 97.7 F (36.5 C), temperature source Oral, resp. rate 17, height '5\' 4"'$  (1.626 m), weight 71.7 kg (158 lb), SpO2 100 %.Body mass index is 27.12 kg/m.  General Appearance: Fairly Groomed  Eye Contact:  Good  Speech:  Clear and Coherent  Volume:  Normal  Mood:  Anxious and Dysphoric  Affect:  Congruent  Thought Process:  Coherent  Orientation:  Full (Time, Place, and Person)  Thought Content:  Logical  Suicidal Thoughts:  No  Homicidal Thoughts:  No  Memory:  Immediate;   Good Recent;   Good Remote;   Fair  Judgement:  Fair  Insight:  Fair  Psychomotor Activity:  Normal  Concentration:  Concentration: Fair  Recall:  [employer] of Knowledge:  Fair  Language:  Fair  Akathisia:  No  Handed:  Right  AIMS (if indicated):     Assets:  Desire for Improvement Housing Resilience Social Support  ADL's:  Intact  Cognition:  WNL  Sleep:        Treatment Plan Summary: Daily contact with patient to assess and evaluate symptoms and progress in treatment, Medication management and Plan This is a 26 year old woman with panic attacks and mild depression. We discussed treatment options. We  discussed the option of psychotherapy. Possible benefit is lasting and effective treatment with minimal side effects and no risk to the baby. The downside is the difficulty of obtaining appropriate cognitive behavioral therapy both initially and with continuing treatments. We also discussed medication treatment. Suggested SSRIs specifically Zoloft. Potential benefit his knees of treatment and potential improvement in both anxiety and depressive symptoms. Potential risks of fetal pulmonary hypertension or preterm delivery discussed. Other side effects discussed. Patient prefers to try a initial trial of Zoloft. Prescription will be done for 25 mg once a day. This can be increased gradually if she tolerates it. Order done. Will follow-up in the hospital as needed.  Disposition: Patient does not meet criteria for psychiatric inpatient admission. Supportive therapy provided about ongoing stressors.  Alethia Berthold, MD 06/11/2016  3:01 PM

## 2016-06-11 NOTE — Progress Notes (Addendum)
PAD#0 IUP at 21 4/7  Weeks with unresolved UTI S: Feeling some better but, still having some lower abd pain and lower back pain. No more vomiting this am. Concerned mostly about who the father of the baby is as her former BF maybe or the man she is living with now for 5 months. They have been together ever since she left her other BF as she accused her of cheating. Her BF now has 1 kidney and a deformed pinky finger and is currently working with his dad. Comments she has had white in her BM's as a child and also recently. Denies any nocturnal rectal itching.  O:  Vitals:   06/11/16 0837 06/11/16 1154  BP: (!) 91/48 (!) 91/46  Pulse: 90 89  Resp: 16 17  Temp: 98 F (36.7 C) 97.7 F (36.5 C)  Gen: 26 yo white female In NAD. HEENT: normcephalic, Eyes non-icteric. Lungs: CTA bilat, no W/R/R Heart: S1S2, RRR, No M/R/G. Abd: Gravid, +FHR per RN, some tenderness in the lower pelvic region CVAT: Some tenderness in the flanks.  Psych: Discussion re: stressors with pt living with new BF and the FOB issue. Pt also is untreated for depression and anxiety at this time. She is very vocal and gets irritable quickly. Extrem: neg Edema, neg Homans Voiding well, Taking po well. CBC    Component Value Date/Time   WBC 14.4 (H) 06/11/2016 0228   RBC 3.49 (L) 06/11/2016 0228   HGB 10.9 (L) 06/11/2016 0228   HCT 30.5 (L) 06/11/2016 0228   PLT 189 06/11/2016 0228   MCV 87.5 06/11/2016 0228   MCH 31.3 06/11/2016 0228   MCHC 35.7 06/11/2016 0228   RDW 14.4 06/11/2016 0228   LYMPHSABS 1.2 05/28/2016 2127   MONOABS 0.8 05/28/2016 2127   EOSABS 0.1 05/28/2016 2127   BASOSABS 0.0 05/28/2016 2127   Drugs of Abuse     Component Value Date/Time   LABOPIA NONE DETECTED 06/11/2016 0042   COCAINSCRNUR NONE DETECTED 06/11/2016 0042   LABBENZ NONE DETECTED 06/11/2016 0042   AMPHETMU NONE DETECTED 06/11/2016 0042   THCU NONE DETECTED 06/11/2016 0042   LABBARB NONE DETECTED 06/11/2016 0042  Na and K and  albumin are low.  A: PAD#0, Unresolved UTI, formerly concerned about Ch[place] but, [facility] not [facility]. [facility] Antib[facility]. Pt admits she has not taken every dose.  2. Anxiety and Depression 3. White Stools (unsure if she is addressing mucous or chalky BM's) 4. Hypokalemia, hyponatremia and hypoalbuminemia P: Nurses to evaluate BM's and see if we need to send for evaluation. 2. Stool for Ovar and Parasites 3. Psych to evaluate pt for anxiety and depression. Pt had recent hospital admission for psych. 4. Disc at length with Dr Dalbert GarnetBeasley: pt has +nitrites, +WBC TNTC, WBC in clumps, and will stop [place]ent and Clinda and Bactrim and start on Zosyn 3.375 mg q 6 hours per pharmacy consult. 5. Use IV meds to correct UTI as pt has had multiple UTI's since Jan: GBS +. Enterococcus+, E coli. Consider urology if needed. Pt admits to missing pills so perhaps she is treating self inconsistently and cannot get better. 6. Hydration, Antibiotics  IV and continue to observe. Replace KCL and NA.  Sharee Pimplearon W. Brynley Cuddeback, MSN, CNm, FNP

## 2016-06-11 NOTE — Discharge Summary (Signed)
Obstetric Discharge Summary Reason for Admission: unresolved UTI in pregnancy Prenatal Procedures: ultrasound Intrapartum Procedures: None Postpartum Procedures: none Complications-Operative and Postpartum: none Hemoglobin  Date Value Ref Range Status  06/14/2016 9.9 (L) 12.0 - 16.0 g/dL Final   HCT  Date Value Ref Range Status  06/14/2016 27.7 (L) 35.0 - 47.0 % Final    Physical Exam:  General: alert, cooperative and appears stated age, Afebrile, VSS.  Heart: S1S2, RRR, No M/R/G Lungs: CTA bilat, no W/R/R Lochia: none Uterine Fundus: Gravid Incision:None DVT Evaluation: No Homans  Discharge Diagnoses: UTI in 2nd trimester  Discharge Information: Date: 06/14/2016 Activity: pelvic rest Diet: routine Medications: PNV and Iron, Keflex - take 4x per day for 7 more days and then once daily until end of pregnancy Condition: stable Instructions: refer to practice specific booklet and Push po fluids, take antibiotics as prescribed Discharge to: home   Newborn Data: This patient has no babies on file.   Chelsea C Ward 06/14/2016, 11:12 AM

## 2016-06-12 LAB — CBC
HCT: 28.8 % — ABNORMAL LOW (ref 35.0–47.0)
HEMOGLOBIN: 10.3 g/dL — AB (ref 12.0–16.0)
MCH: 31.2 pg (ref 26.0–34.0)
MCHC: 35.7 g/dL (ref 32.0–36.0)
MCV: 87.5 fL (ref 80.0–100.0)
PLATELETS: 185 10*3/uL (ref 150–440)
RBC: 3.29 MIL/uL — ABNORMAL LOW (ref 3.80–5.20)
RDW: 14 % (ref 11.5–14.5)
WBC: 10 10*3/uL (ref 3.6–11.0)

## 2016-06-12 MED ORDER — CEFAZOLIN IN D5W 1 GM/50ML IV SOLN
1.0000 g | Freq: Four times a day (QID) | INTRAVENOUS | Status: DC
  Administered 2016-06-12 (×2): 1 g via INTRAVENOUS
  Filled 2016-06-12 (×5): qty 50

## 2016-06-12 MED ORDER — CEFAZOLIN IN D5W 1 GM/50ML IV SOLN
1.0000 g | Freq: Once | INTRAVENOUS | Status: AC
  Administered 2016-06-12: 1 g via INTRAVENOUS
  Filled 2016-06-12: qty 50

## 2016-06-12 NOTE — Progress Notes (Signed)
Patient ID: Yvonne ShieldsMariah Shea, female   DOB: 05-Aug-1990, 26 y.o.   MRN: [id number] Urine culture resistant to many antibiotics . Already switched to Ancef. Will given an additional 1 gm then 1 gm q 6 hrs

## 2016-06-12 NOTE — Progress Notes (Signed)
Subjective: 21 weeks admitted for lower pelvic pain and right flank pain  UA culture > 100,000 gm neg rods  Currently on zosyn  Patient reports pain improving   Objective: I have reviewed patient's vital signs., afebrile   General: alert and cooperative Resp: clear to auscultation bilaterally Cardio: regular rate and rhythm, S1, S2 normal, no murmur, click, rub or gallop GI: soft, non-tender; bowel sounds normal; no masses,  no organomegaly abd soft NT   Assessment/Plan: HD#2 UTI  Will switch antibiotics to ancef 1 gm q 6 hr    continue supportive care   LOS: 1 day    Yvonne Shea 06/12/2016, 12:46 PM

## 2016-06-12 NOTE — Progress Notes (Signed)
Pharmacy Antibiotic Note  Lynnda ShieldsMariah Hefferan is a 26 y.o. female admitted on 06/10/2016 with UTI.  Pharmacy has been consulted for Zosyn dosing.  ?unresolved UTI; multiple UTI's since Jan: GBS +. Enterococcus+, E coli. Patient admits to missing abx doses.  Plan: Zosyn 3.375g IV q8h (4 hour infusion).  Height: 5\' 4"  (162.6 cm) Weight: 158 lb (71.7 kg) IBW/kg (Calculated) : 54.7  Temp (24hrs), Avg:98.3 F (36.8 C), Min:97.7 F (36.5 C), Max:98.9 F (37.2 C)   Recent Labs Lab 06/11/16 0228 06/12/16 0509  WBC 14.4* 10.0  CREATININE 0.46  --     Estimated Creatinine Clearance: 104.4 mL/min (by C-G formula based on SCr of 0.46 mg/dL).    Allergies  Allergen Reactions  . Sulfa Antibiotics Nausea Only and Nausea And Vomiting    Antimicrobials this admission: Natasha BenceGent 3/18  Clinda 3/18 Zosyn 3/18 >>     Dose adjustments this admission:    Microbiology results:   BCx:   3/18 UCx: pending    Sputum:      MRSA PCR:    Thank you for allowing pharmacy to be a part of this patient's care.  Tahjai Schetter A 06/12/2016 8:20 AM

## 2016-06-13 LAB — BASIC METABOLIC PANEL
ANION GAP: 5 (ref 5–15)
BUN: <5 mg/dL — ABNORMAL LOW (ref 6–20)
CALCIUM: 8 mg/dL — AB (ref 8.9–10.3)
CO2: 20 mmol/L — AB (ref 22–32)
Chloride: 109 mmol/L (ref 101–111)
Creatinine, Ser: 0.44 mg/dL (ref 0.44–1.00)
Glucose, Bld: 74 mg/dL (ref 65–99)
POTASSIUM: 4 mmol/L (ref 3.5–5.1)
Sodium: 134 mmol/L — ABNORMAL LOW (ref 135–145)

## 2016-06-13 LAB — CBC
HEMATOCRIT: 27.6 % — AB (ref 35.0–47.0)
Hemoglobin: 9.8 g/dL — ABNORMAL LOW (ref 12.0–16.0)
MCH: 31.2 pg (ref 26.0–34.0)
MCHC: 35.5 g/dL (ref 32.0–36.0)
MCV: 87.9 fL (ref 80.0–100.0)
Platelets: 178 10*3/uL (ref 150–440)
RBC: 3.14 MIL/uL — AB (ref 3.80–5.20)
RDW: 14.3 % (ref 11.5–14.5)
WBC: 8 10*3/uL (ref 3.6–11.0)

## 2016-06-13 LAB — URINE CULTURE
Culture: >=100000 — AB
Special Requests: NORMAL

## 2016-06-13 MED ORDER — CEFAZOLIN IN D5W 1 GM/50ML IV SOLN
1.0000 g | Freq: Four times a day (QID) | INTRAVENOUS | Status: DC
  Administered 2016-06-13 – 2016-06-14 (×6): 1 g via INTRAVENOUS
  Filled 2016-06-13 (×8): qty 50

## 2016-06-13 NOTE — Progress Notes (Signed)
HD#3 for multi drug resistant UTI . IV Ancef started yesterday . Pain better No fever  Subjective: Pain improving  Objective: I have reviewed patient's vital signs and medications.  General: alert and cooperative Resp: clear to auscultation bilaterally Cardio: regular rate and rhythm, S1, S2 normal, no murmur, click, rub or gallop GI: soft, non-tender; bowel sounds normal; no masses,  no organomegaly  Assessment: UTI  With pelvic flank pain improving    Plan: Cont IV ancef for an additional 24 hrs and then d/c home if she continues to be afebrile and her pain improves    SCHERMERHORN,THOMAS 06/13/2016, 9:56 AM

## 2016-06-14 LAB — CBC
HEMATOCRIT: 27.7 % — AB (ref 35.0–47.0)
Hemoglobin: 9.9 g/dL — ABNORMAL LOW (ref 12.0–16.0)
MCH: 31.2 pg (ref 26.0–34.0)
MCHC: 35.7 g/dL (ref 32.0–36.0)
MCV: 87.3 fL (ref 80.0–100.0)
Platelets: 195 10*3/uL (ref 150–440)
RBC: 3.17 MIL/uL — AB (ref 3.80–5.20)
RDW: 14.2 % (ref 11.5–14.5)
WBC: 7.5 10*3/uL (ref 3.6–11.0)

## 2016-06-14 MED ORDER — CEPHALEXIN 500 MG PO CAPS: 500.0000 mg | ORAL_CAPSULE | Freq: Four times a day (QID) | ORAL | 0 refills | Status: DC

## 2016-06-14 MED ORDER — CEPHALEXIN 500 MG PO CAPS: 500.0000 mg | ORAL_CAPSULE | Freq: Every day | ORAL | 5 refills | Status: DC

## 2016-06-14 MED ORDER — SERTRALINE HCL 25 MG PO TABS: 25.0000 mg | ORAL_TABLET | Freq: Every day | ORAL | 11 refills | Status: DC

## 2016-06-14 NOTE — Progress Notes (Signed)
Discharge teaching reviewed with pt.  Verb u/o.  Rx given for home use

## 2016-06-14 NOTE — Progress Notes (Signed)
Discharged to home to car ambulatory.

## 2016-07-12 ENCOUNTER — Observation Stay
Admission: EM | Admit: 2016-07-12 | Discharge: 2016-07-12 | Disposition: A | Discharge status: Home / Self Care | Payer: Medicaid Other | Attending: Obstetrics and Gynecology | Admitting: Obstetrics and Gynecology

## 2016-07-12 DIAGNOSIS — R109 Unspecified abdominal pain: Secondary | ICD-10-CM | POA: Diagnosis not present

## 2016-07-12 DIAGNOSIS — O26892 Other specified pregnancy related conditions, second trimester: Principal | ICD-10-CM | POA: Insufficient documentation

## 2016-07-12 DIAGNOSIS — Z79899 Other long term (current) drug therapy: Secondary | ICD-10-CM | POA: Diagnosis not present

## 2016-07-12 DIAGNOSIS — Z882 Allergy status to sulfonamides status: Secondary | ICD-10-CM | POA: Diagnosis not present

## 2016-07-12 DIAGNOSIS — O9989 Other specified diseases and conditions complicating pregnancy, childbirth and the puerperium: Secondary | ICD-10-CM | POA: Diagnosis not present

## 2016-07-12 DIAGNOSIS — E876 Hypokalemia: Secondary | ICD-10-CM | POA: Diagnosis not present

## 2016-07-12 DIAGNOSIS — O99342 Other mental disorders complicating pregnancy, second trimester: Secondary | ICD-10-CM | POA: Diagnosis not present

## 2016-07-12 DIAGNOSIS — Z3A26 26 weeks gestation of pregnancy: Secondary | ICD-10-CM | POA: Insufficient documentation

## 2016-07-12 DIAGNOSIS — R509 Fever, unspecified: Secondary | ICD-10-CM | POA: Insufficient documentation

## 2016-07-12 HISTORY — DX: Major depressive disorder, single episode, unspecified: F32.9

## 2016-07-12 HISTORY — DX: Mental disorder, not otherwise specified: F99

## 2016-07-12 HISTORY — DX: Depression, unspecified: F32.A

## 2016-07-12 LAB — CBC
HCT: 33.1 % — ABNORMAL LOW (ref 35.0–47.0)
HEMOGLOBIN: 11.4 g/dL — AB (ref 12.0–16.0)
MCH: 30.4 pg (ref 26.0–34.0)
MCHC: 34.4 g/dL (ref 32.0–36.0)
MCV: 88.3 fL (ref 80.0–100.0)
PLATELETS: 252 10*3/uL (ref 150–440)
RBC: 3.75 MIL/uL — AB (ref 3.80–5.20)
RDW: 13.9 % (ref 11.5–14.5)
WBC: 11.5 10*3/uL — ABNORMAL HIGH (ref 3.6–11.0)

## 2016-07-12 LAB — URINALYSIS, COMPLETE (UACMP) WITH MICROSCOPIC
BILIRUBIN URINE: NEGATIVE
Glucose, UA: NEGATIVE mg/dL
KETONES UR: NEGATIVE mg/dL
Nitrite: NEGATIVE
PROTEIN: 30 mg/dL — AB
Specific Gravity, Urine: 1.004 — ABNORMAL LOW (ref 1.005–1.030)
pH: 7 (ref 5.0–8.0)

## 2016-07-12 LAB — URINE DRUG SCREEN, QUALITATIVE (ARMC ONLY)
AMPHETAMINES, UR SCREEN: NOT DETECTED
Barbiturates, Ur Screen: NOT DETECTED
Benzodiazepine, Ur Scrn: NOT DETECTED
Cannabinoid 50 Ng, Ur ~~LOC~~: NOT DETECTED
Cocaine Metabolite,Ur ~~LOC~~: NOT DETECTED
MDMA (ECSTASY) UR SCREEN: NOT DETECTED
Methadone Scn, Ur: NOT DETECTED
OPIATE, UR SCREEN: NOT DETECTED
PHENCYCLIDINE (PCP) UR S: NOT DETECTED
Tricyclic, Ur Screen: NOT DETECTED

## 2016-07-12 LAB — COMPREHENSIVE METABOLIC PANEL
ALBUMIN: 3 g/dL — AB (ref 3.5–5.0)
ALT: 10 U/L — ABNORMAL LOW (ref 14–54)
ANION GAP: 8 (ref 5–15)
AST: 17 U/L (ref 15–41)
Alkaline Phosphatase: 143 U/L — ABNORMAL HIGH (ref 38–126)
BUN: 6 mg/dL (ref 6–20)
CO2: 23 mmol/L (ref 22–32)
Calcium: 8.6 mg/dL — ABNORMAL LOW (ref 8.9–10.3)
Chloride: 104 mmol/L (ref 101–111)
Creatinine, Ser: <0.3 mg/dL — ABNORMAL LOW (ref 0.44–1.00)
GLUCOSE: 76 mg/dL (ref 65–99)
POTASSIUM: 3.1 mmol/L — AB (ref 3.5–5.1)
SODIUM: 135 mmol/L (ref 135–145)
Total Bilirubin: 0.5 mg/dL (ref 0.3–1.2)
Total Protein: 7.1 g/dL (ref 6.5–8.1)

## 2016-07-12 NOTE — OB Triage Note (Addendum)
Ms. Brisbin sent over from [facility] for evaluation and monitoring following office visit. Pt reports several complaints: 1- pain during intercourse, started 2 weeks ago, resolved briefly, pain has returned (last intercourse Saturday) 2- right sided and upper right back pain  3- fever Monday of 101.7 4- Nausea, vomiting (4-5x), diarrhea (5-6x) since Monday 5- Larey Seat getting out of bathtub Monday, did not hit abdomen  Pt denies bleeding, LOF, reports positive fetal movement.   Per pt, she has only had about 8 oz. water today, drinks mostly tea and Code Red Mtn. Dew, also reports that she self-discontinued Zoloft prescription that was started by psychiatrist during hospital visit a few weeks ago, did not discuss with her provider before discontinuing this medication.

## 2016-07-12 NOTE — Progress Notes (Addendum)
Patient ID: Yvonne Shea, female   DOB: [birth year], 26 y.o.   MRN: [id number]  Yvonne Shea is a 26 y.o. female. She is at [redacted]w[redacted]d gestation. No LMP recorded. Patient is pregnant. Estimated Date of Delivery: 10/18/16  Prenatal care site: [facility] CC: complains of a 2 days h/o right flank pain . Fever on Monday 101.7 with diarrhea . None today . Prior admission 4 weeks ago treated with abx for UTI ( ecoli with multiple resistant) She denies LOF , Vag bleeding . Temp in office today 98.1   Maternal Medical History:   Past Medical History:  Diagnosis Date  . Depression   . Mental disorder     Past Surgical History:  Procedure Laterality Date  . APPENDECTOMY    . CESAREAN SECTION    . CHOLECYSTECTOMY    . DILATION AND CURETTAGE OF UTERUS      Allergies  Allergen Reactions  . Sulfa Antibiotics Nausea Only and Nausea And Vomiting    Prior to Admission medications   Medication Sig Start Date End Date Taking? Authorizing Provider  cephALEXin (KEFLEX) 500 MG capsule Take 1 capsule (500 mg total) by mouth 4 (four) times daily. 06/14/16   Chelsea C Ward, MD  cephALEXin (KEFLEX) 500 MG capsule Take 1 capsule (500 mg total) by mouth daily. 06/14/16   Chelsea C Ward, MD  sertraline (ZOLOFT) 25 MG tablet Take 1 tablet (25 mg total) by mouth daily. 06/15/16   Elenora Fender Ward, MD     Social History: She  reports that she has been smoking Cigarettes.  She has been smoking about 1.00 pack per day. She has never used smokeless tobacco. She reports that she does not drink alcohol or use drugs. + MJ  On drug screen 1 month ago  Family History: family history is not on file.   Review of Systems: A full review of systems was performed and negative except as noted in the HPI.   POBX : 2 prior c/s   O:  BP (!) 98/53 (BP Location: Left Arm)   Pulse 99   Temp 98.6 F (37 C) (Oral)   Resp 16  Results for orders placed or performed during the hospital encounter of 07/12/16 (from the past 48  hour(s))  CBC   Collection Time: 07/12/16  5:14 PM  Result Value Ref Range   WBC 11.5 (H) 3.6 - 11.0 K/uL   RBC 3.75 (L) 3.80 - 5.20 MIL/uL   Hemoglobin 11.4 (L) 12.0 - 16.0 g/dL   HCT 84.6 (L) 96.2 - 95.2 %   MCV 88.3 80.0 - 100.0 fL   MCH 30.4 26.0 - 34.0 pg   MCHC 34.4 32.0 - 36.0 g/dL   RDW 84.1 32.4 - 40.1 %   Platelets 252 150 - 440 K/uL  Comprehensive metabolic panel   Collection Time: 07/12/16  5:14 PM  Result Value Ref Range   Sodium 135 135 - 145 mmol/L   Potassium 3.1 (L) 3.5 - 5.1 mmol/L   Chloride 104 101 - 111 mmol/L   CO2 23 22 - 32 mmol/L   Glucose, Bld 76 65 - 99 mg/dL   BUN 6 6 - 20 mg/dL   Creatinine, Ser <0.27 (L) 0.44 - 1.00 mg/dL   Calcium 8.6 (L) 8.9 - 10.3 mg/dL   Total Protein 7.1 6.5 - 8.1 g/dL   Albumin 3.0 (L) 3.5 - 5.0 g/dL   AST 17 15 - 41 U/L   ALT 10 (L) 14 - 54 U/L  Alkaline Phosphatase 143 (H) 38 - 126 U/L   Total Bilirubin 0.5 0.3 - 1.2 mg/dL   GFR calc non Af Amer NOT CALCULATED >60 mL/min   GFR calc Af Amer NOT CALCULATED >60 mL/min   Anion gap 8 5 - 15  Urinalysis, Complete w Microscopic   Collection Time: 07/12/16  5:48 PM  Result Value Ref Range   Color, Urine YELLOW (A) YELLOW   APPearance HAZY (A) CLEAR   Specific Gravity, Urine 1.004 (L) 1.005 - 1.030   pH 7.0 5.0 - 8.0   Glucose, UA NEGATIVE NEGATIVE mg/dL   Hgb urine dipstick SMALL (A) NEGATIVE   Bilirubin Urine NEGATIVE NEGATIVE   Ketones, ur NEGATIVE NEGATIVE mg/dL   Protein, ur 30 (A) NEGATIVE mg/dL   Nitrite NEGATIVE NEGATIVE   Leukocytes, UA LARGE (A) NEGATIVE   RBC / HPF 0-5 0 - 5 RBC/hpf   WBC, UA TOO NUMEROUS TO COUNT 0 - 5 WBC/hpf   Bacteria, UA RARE (A) NONE SEEN   Squamous Epithelial / LPF 0-5 (A) NONE SEEN   WBC Clumps PRESENT    Mucous PRESENT   Urine Drug Screen, Qualitative ([facility] only)   Collection Time: 07/12/16  5:48 PM  Result Value Ref Range   Tricyclic, Ur Screen NONE DETECTED NONE DETECTED   Amphetamines, Ur Screen NONE DETECTED NONE DETECTED    MDMA (Ecstasy)Ur Screen NONE DETECTED NONE DETECTED   Cocaine Metabolite,Ur [place] NONE DETECTED NONE DETECTED   Opiate, Ur Screen NONE DETECTED NONE DETECTED   Phencyclidine (PCP) Ur S NONE DETECTED NONE DETECTED   Cannabinoid 50 Ng, Ur [place] NONE DETECTED NONE DETECTED   Barbiturates, Ur Screen NONE DETECTED NONE DETECTED   Benzodiazepine, Ur Scrn NONE DETECTED NONE DETECTED   Methadone Scn, Ur NONE DETECTED NONE DETECTED     Constitutional: NAD, AAOx3  HE/ENT: extraocular movements grossly intact, moist mucous membranes CV: RRR PULM: nl respiratory effort, CTABL     Abd: gravid, non-tender, non-distended, soft      Ext: Non-tender, Nonedmeatous   Psych: mood appropriate, speech normal cx closed + long ; utx non tender   NST: reassuring for 26 weeks  Pain dissipated over time    A/P: 26 y.o. [redacted]w[redacted]d here for h/o fever and right flank pain  Hypokalemia   Labor: not present.   Urine culture   If flank pain persist consider renal u/s    Increase potassium rich foods   ----- Ihor Austin Albertine Lafoy MD Attending Obstetrician and Gynecologist [facility], Department of OB/GYN [facility]

## 2016-07-12 NOTE — Discharge Summary (Signed)
Suzy Bouchard, MD  Obstetrics    Hide copied text Hover for attribution information Patient ID: Yvonne Shea, female   DOB: June 05, 1990, 26 y.o.   MRN: [id number]  Yvonne Shea is a 26 y.o. female. She is at [redacted]w[redacted]d gestation. No LMP recorded. Patient is pregnant. Estimated Date of Delivery: 10/18/16  Prenatal care site: [facility] CC: complains of a 2 days h/o right flank pain . Fever on Monday 101.7 with diarrhea . None today . Prior admission 4 weeks ago treated with abx for UTI ( ecoli with multiple resistant) She denies LOF , Vag bleeding . Temp in office today 98.1   Maternal Medical History:       Past Medical History:  Diagnosis Date  . Depression   . Mental disorder          Past Surgical History:  Procedure Laterality Date  . APPENDECTOMY    . CESAREAN SECTION    . CHOLECYSTECTOMY    . DILATION AND CURETTAGE OF UTERUS          Allergies  Allergen Reactions  . Sulfa Antibiotics Nausea Only and Nausea And Vomiting           Prior to Admission medications   Medication Sig Start Date End Date Taking? Authorizing Provider  cephALEXin (KEFLEX) 500 MG capsule Take 1 capsule (500 mg total) by mouth 4 (four) times daily. 06/14/16   Chelsea C Ward, MD  cephALEXin (KEFLEX) 500 MG capsule Take 1 capsule (500 mg total) by mouth daily. 06/14/16   Chelsea C Ward, MD  sertraline (ZOLOFT) 25 MG tablet Take 1 tablet (25 mg total) by mouth daily. 06/15/16   Elenora Fender Ward, MD     Social History: She  reports that she has been smoking Cigarettes.  She has been smoking about 1.00 pack per day. She has never used smokeless tobacco. She reports that she does not drink alcohol or use drugs. + MJ  On drug screen 1 month ago  Family History: family history is not on file.   Review of Systems: A full review of systems was performed and negative except as noted in the HPI.   POBX : 2 prior c/s   O:  BP (!) 98/53 (BP Location: Left  Arm)   Pulse 99   Temp 98.6 F (37 C) (Oral)   Resp 16       Results for orders placed or performed during the hospital encounter of 07/12/16 (from the past 48 hour(s))  CBC   Collection Time: 07/12/16  5:14 PM  Result Value Ref Range   WBC 11.5 (H) 3.6 - 11.0 K/uL   RBC 3.75 (L) 3.80 - 5.20 MIL/uL   Hemoglobin 11.4 (L) 12.0 - 16.0 g/dL   HCT 40.9 (L) 81.1 - 91.4 %   MCV 88.3 80.0 - 100.0 fL   MCH 30.4 26.0 - 34.0 pg   MCHC 34.4 32.0 - 36.0 g/dL   RDW 78.2 95.6 - 21.3 %   Platelets 252 150 - 440 K/uL  Comprehensive metabolic panel   Collection Time: 07/12/16  5:14 PM  Result Value Ref Range   Sodium 135 135 - 145 mmol/L   Potassium 3.1 (L) 3.5 - 5.1 mmol/L   Chloride 104 101 - 111 mmol/L   CO2 23 22 - 32 mmol/L   Glucose, Bld 76 65 - 99 mg/dL   BUN 6 6 - 20 mg/dL   Creatinine, Ser <0.86 (L) 0.44 - 1.00 mg/dL   Calcium  8.6 (L) 8.9 - 10.3 mg/dL   Total Protein 7.1 6.5 - 8.1 g/dL   Albumin 3.0 (L) 3.5 - 5.0 g/dL   AST 17 15 - 41 U/L   ALT 10 (L) 14 - 54 U/L   Alkaline Phosphatase 143 (H) 38 - 126 U/L   Total Bilirubin 0.5 0.3 - 1.2 mg/dL   GFR calc non Af Amer NOT CALCULATED >60 mL/min   GFR calc Af Amer NOT CALCULATED >60 mL/min   Anion gap 8 5 - 15  Urinalysis, Complete w Microscopic   Collection Time: 07/12/16  5:48 PM  Result Value Ref Range   Color, Urine YELLOW (A) YELLOW   APPearance HAZY (A) CLEAR   Specific Gravity, Urine 1.004 (L) 1.005 - 1.030   pH 7.0 5.0 - 8.0   Glucose, UA NEGATIVE NEGATIVE mg/dL   Hgb urine dipstick SMALL (A) NEGATIVE   Bilirubin Urine NEGATIVE NEGATIVE   Ketones, ur NEGATIVE NEGATIVE mg/dL   Protein, ur 30 (A) NEGATIVE mg/dL   Nitrite NEGATIVE NEGATIVE   Leukocytes, UA LARGE (A) NEGATIVE   RBC / HPF 0-5 0 - 5 RBC/hpf   WBC, UA TOO NUMEROUS TO COUNT 0 - 5 WBC/hpf   Bacteria, UA RARE (A) NONE SEEN   Squamous Epithelial / LPF 0-5 (A) NONE SEEN   WBC Clumps PRESENT    Mucous PRESENT     Urine Drug Screen, Qualitative ([facility] only)   Collection Time: 07/12/16  5:48 PM  Result Value Ref Range   Tricyclic, Ur Screen NONE DETECTED NONE DETECTED   Amphetamines, Ur Screen NONE DETECTED NONE DETECTED   MDMA (Ecstasy)Ur Screen NONE DETECTED NONE DETECTED   Cocaine Metabolite,Ur [place] NONE DETECTED NONE DETECTED   Opiate, Ur Screen NONE DETECTED NONE DETECTED   Phencyclidine (PCP) Ur S NONE DETECTED NONE DETECTED   Cannabinoid 50 Ng, Ur [place] NONE DETECTED NONE DETECTED   Barbiturates, Ur Screen NONE DETECTED NONE DETECTED   Benzodiazepine, Ur Scrn NONE DETECTED NONE DETECTED   Methadone Scn, Ur NONE DETECTED NONE DETECTED   Constitutional: NAD, AAOx3  HE/ENT: extraocular movements grossly intact, moist mucous membranes CV: RRR PULM: nl respiratory effort, CTABL                                         Abd: gravid, non-tender, non-distended, soft                                                  Ext: Non-tender, Nonedmeatous                     Psych: mood appropriate, speech normal cx closed + long ; utx non tender   NST: reassuring for 26 weeks  Pain dissipated over time    A/P: 26 y.o. [redacted]w[redacted]d here for h/o fever and right flank pain  Hypokalemia   Labor: not present.   Urine culture   If flank pain persist consider renal u/s    Increase potassium rich foods   ----- Ihor Austin Kobie Whidby MD Attending Obstetrician and Gynecologist [facility], Department of OB/GYN [facility]     Electronically signed by Ihor Austin Ayva Veilleux

## 2016-07-14 LAB — URINE CULTURE
Culture: 30000 — AB
Special Requests: NORMAL

## 2016-08-09 ENCOUNTER — Observation Stay
Admission: EM | Admit: 2016-08-09 | Discharge: 2016-08-09 | Disposition: A | Discharge status: Home / Self Care | Payer: Medicaid Other | Attending: Obstetrics and Gynecology | Admitting: Obstetrics and Gynecology

## 2016-08-09 ENCOUNTER — Encounter: Payer: Self-pay | Admitting: *Deleted

## 2016-08-09 DIAGNOSIS — O4703 False labor before 37 completed weeks of gestation, third trimester: Principal | ICD-10-CM | POA: Insufficient documentation

## 2016-08-09 DIAGNOSIS — F329 Major depressive disorder, single episode, unspecified: Secondary | ICD-10-CM | POA: Insufficient documentation

## 2016-08-09 DIAGNOSIS — Z881 Allergy status to other antibiotic agents status: Secondary | ICD-10-CM | POA: Diagnosis not present

## 2016-08-09 DIAGNOSIS — Z3A30 30 weeks gestation of pregnancy: Secondary | ICD-10-CM | POA: Insufficient documentation

## 2016-08-09 DIAGNOSIS — F17210 Nicotine dependence, cigarettes, uncomplicated: Secondary | ICD-10-CM | POA: Diagnosis not present

## 2016-08-09 DIAGNOSIS — Z79899 Other long term (current) drug therapy: Secondary | ICD-10-CM | POA: Insufficient documentation

## 2016-08-09 DIAGNOSIS — O4700 False labor before 37 completed weeks of gestation, unspecified trimester: Secondary | ICD-10-CM | POA: Diagnosis present

## 2016-08-09 DIAGNOSIS — O479 False labor, unspecified: Secondary | ICD-10-CM | POA: Diagnosis present

## 2016-08-09 NOTE — OB Triage Note (Signed)
Recvd to OBS 1 per wheelchair from ED with c/o contractions ever 7 minutes for the last hour with n/v with contractions. Changed to gown and to bed.  EFM applied.  Denies bleeding or leaking fluid.  Oriented to room and pan of care discussed.  Verbalized understanding and agrees with plan.

## 2016-08-09 NOTE — Progress Notes (Signed)
Patient ID: Yvonne Shea, female   DOB: 24-Mar-1991, 26 y.o.   MRN: [id number]  Yvonne Shea is a 26 y.o. female. She is at 2964w0d gestation. No LMP recorded. Patient is pregnant. Estimated Date of Delivery: 10/18/16  [facility]  Chief complaint: uterine contractions q 7 min apart  No LOF , NO vag bleeding   S: Resting comfortably. no CTX, no VB.no LOF,  Active fetal movement.   Maternal Medical History:   Past Medical History:  Diagnosis Date  . Depression   . Mental disorder     Past Surgical History:  Procedure Laterality Date  . APPENDECTOMY    . CESAREAN SECTION    . CHOLECYSTECTOMY    . DILATION AND CURETTAGE OF UTERUS      Allergies  Allergen Reactions  . Sulfa Antibiotics Nausea Only and Nausea And Vomiting    Prior to Admission medications   Medication Sig Start Date End Date Taking? Authorizing Provider  cephALEXin (KEFLEX) 500 MG capsule Take 1 capsule (500 mg total) by mouth 4 (four) times daily. 06/14/16   Ward, Elenora Fenderhelsea C, MD  cephALEXin (KEFLEX) 500 MG capsule Take 1 capsule (500 mg total) by mouth daily. 06/14/16   Ward, Elenora Fenderhelsea C, MD  sertraline (ZOLOFT) 25 MG tablet Take 1 tablet (25 mg total) by mouth daily. 06/15/16   Ward, Elenora Fenderhelsea C, MD     Social History: She  reports that she has been smoking Cigarettes.  She has been smoking about 1.00 pack per day. She has never used smokeless tobacco. She reports that she does not drink alcohol or use drugs.  Family History: family history is not on file.  no history of gyn cancers  Review of Systems: A full review of systems was performed and negative except as noted in the HPI.     O:  BP (!) 101/59 (BP Location: Left Arm)   Pulse 99   Temp 98.1 F (36.7 C) (Oral)   Resp 16   Ht 5\' 4"  (1.626 m)   Wt 162 lb (73.5 kg)   BMI 27.81 kg/m  No results found for this or any previous visit (from the past 48 hour(s)).   Constitutional: NAD, AAOx3  HE/ENT: extraocular movements  grossly intact, moist mucous membranes CV: RRR PULM: nl respiratory effort, CTABL     Abd: gravid, non-tender, non-distended, soft      Ext: Non-tender, Nonedmeatous   Psych: mood appropriate, speech normal Pelvic deferred  NST:  Baseline:130  Variability: moderate Accelerations present x >2 Decelerations absent  rare uterine ctx  Time 20mins REACTIVE NST    A/P: 26 y.o. 464w0d here for antenatal surveillance for CTX   Labor: not present.   Fetal Wellbeing: Reassuring Cat 1 tracing.  Reactive NST   D/c home stable, precautions reviewed, follow-up as scheduled.   ----- Jennell Cornerhomas Yomara Toothman, MD Attending Obstetrician and Gynecologist [facility], Department of OB/GYN [facility]

## 2016-08-09 NOTE — Discharge Summary (Signed)
Julyan Shea, Yvonne Austinhomas J, MD  Obstetrics    [] Hide copied text [] Hover for attribution information Patient ID: Yvonne ShieldsMariah Shea, female   DOB: [birth year], 26 y.o.   MRN: [id number]  Yvonne ShieldsMariah Shea is a 26 y.o. female. She is at 2467w0d gestation. No LMP recorded. Patient is pregnant. Estimated Date of Delivery: 10/18/16  [facility]  Chief complaint: uterine contractions q 7 min apart  No LOF , NO vag bleeding   S: Resting comfortably. noCTX, no VB.no LOF, Active fetal movement.   Maternal Medical History:       Past Medical History:  Diagnosis Date  . Depression   . Mental disorder          Past Surgical History:  Procedure Laterality Date  . APPENDECTOMY    . CESAREAN SECTION    . CHOLECYSTECTOMY    . DILATION AND CURETTAGE OF UTERUS          Allergies  Allergen Reactions  . Sulfa Antibiotics Nausea Only and Nausea And Vomiting           Prior to Admission medications   Medication Sig Start Date End Date Taking? Authorizing Provider  cephALEXin (KEFLEX) 500 MG capsule Take 1 capsule (500 mg total) by mouth 4 (four) times daily. 06/14/16   Ward, Elenora Fenderhelsea C, MD  cephALEXin (KEFLEX) 500 MG capsule Take 1 capsule (500 mg total) by mouth daily. 06/14/16   Ward, Elenora Fenderhelsea C, MD  sertraline (ZOLOFT) 25 MG tablet Take 1 tablet (25 mg total) by mouth daily. 06/15/16   Ward, Elenora Fenderhelsea C, MD     Social History: She  reports that she has been smoking Cigarettes.  She has been smoking about 1.00 pack per day. She has never used smokeless tobacco. She reports that she does not drink alcohol or use drugs.  Family History: family history is not on file.  no history of gyn cancers  Review of Systems: A full review of systems was performed and negative except as noted in the HPI.     O:  BP (!) 101/59 (BP Location: Left Arm)   Pulse 99   Temp 98.1 F (36.7 C) (Oral)   Resp 16   Ht 5\' 4"  (1.626 m)   Wt 162 lb (73.5 kg)    BMI 27.81 kg/m  No results found for this or any previous visit (from the past 48 hour(s)).   Constitutional: NAD, AAOx3  HE/ENT: extraocular movements grossly intact, moist mucous membranes CV: RRR PULM: nl respiratory effort, CTABL                                         Abd: gravid, non-tender, non-distended, soft                                                  Ext: Non-tender, Nonedmeatous                     Psych: mood appropriate, speech normal Pelvic deferred  NST:  Baseline:130  Variability: moderate Accelerations present x >2 Decelerations absent  rare uterine ctx  Time 20mins REACTIVE NST    A/P: 26 y.o. 5467w0d here for antenatal surveillance for CTX   Labor: not present.   Fetal Wellbeing:  Reassuring Cat 1 tracing.  Reactive NST   D/c home stable, precautions reviewed, follow-up as scheduled.   ----- Jennell Corner, MD Attending Obstetrician and Gynecologist [facility], Department of OB/GYN [place]

## 2016-09-10 ENCOUNTER — Observation Stay
Admission: EM | Admit: 2016-09-10 | Discharge: 2016-09-11 | Disposition: A | Discharge status: Home / Self Care | Payer: Medicaid Other | Attending: Obstetrics and Gynecology | Admitting: Obstetrics and Gynecology

## 2016-09-10 DIAGNOSIS — Z3490 Encounter for supervision of normal pregnancy, unspecified, unspecified trimester: Secondary | ICD-10-CM

## 2016-09-10 DIAGNOSIS — Z3A34 34 weeks gestation of pregnancy: Secondary | ICD-10-CM | POA: Insufficient documentation

## 2016-09-10 DIAGNOSIS — O4702 False labor before 37 completed weeks of gestation, second trimester: Principal | ICD-10-CM | POA: Insufficient documentation

## 2016-09-10 MED ORDER — ACETAMINOPHEN 325 MG PO TABS: 650.0000 mg | ORAL_TABLET | ORAL | Status: DC | PRN

## 2016-09-10 MED ORDER — CALCIUM CARBONATE ANTACID 500 MG PO CHEW: 2.0000 | CHEWABLE_TABLET | ORAL | Status: DC | PRN

## 2016-09-10 NOTE — OB Triage Provider Note (Signed)
TRIAGE NOTE to rule out Preterm Labor   History of Present Illness: Yvonne Shea is a 26 y.o. [id number] at [redacted]w[redacted]d presenting to triage for rule out preterm labor. Pt reports lower back pain, vomiting today.   Patient reports the fetal movement as active. Patient reports uterine contraction  activity as irregular, . Patient reports  vaginal bleeding as none. Patient describes fluid per vagina as None.  Prior C/S x2  Patient Active Problem List   Diagnosis Date Noted  . Preterm uterine contractions 08/09/2016  . Flank pain, acute 07/12/2016  . UTI (urinary tract infection) during pregnancy, second trimester 06/11/2016  . Panic disorder 06/11/2016  . 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 Uterine contractions during pregnancy 05/28/2016  . First trimester screening     Past Medical History:  Diagnosis Date  . Depression   . Mental disorder     Past Surgical History:  Procedure Laterality Date  . APPENDECTOMY    . CESAREAN SECTION    . CHOLECYSTECTOMY    . DILATION AND CURETTAGE OF UTERUS      OB History  Gravida Para Term Preterm AB Living  5 2     2 2   SAB TAB Ectopic Multiple Live Births  2       1    # Outcome Date GA Lbr Len/2nd Weight Sex Delivery Anes PTL Lv  5 Current           4 Para         LIV  3 Para           2 SAB           1 SAB               Social History   Social History  . Marital status: Single    Spouse name: N/A  . Number of children: N/A  . Years of education: N/A   Social History Main Topics  . Smoking status: Current Every Day Smoker    Packs/day: 1.00    Types: Cigarettes  . Smokeless tobacco: Never Used  . Alcohol use No  . Drug use: No  . Sexual activity: Yes   Other Topics Concern  . None   Social History Narrative  . None    History reviewed. No pertinent family history.  Allergies  Allergen Reactions  . Sulfa Antibiotics Nausea Only and Nausea And Vomiting    Prescriptions Prior to Admission   Medication Sig Dispense Refill Last Dose  . cephALEXin (KEFLEX) 500 MG capsule Take 1 capsule (500 mg total) by mouth 4 (four) times daily. (Patient not taking: Reported on 09/10/2016) 28 capsule 0 Not Taking at Unknown time  . cephALEXin (KEFLEX) 500 MG capsule Take 1 capsule (500 mg total) by mouth daily. (Patient not taking: Reported on 09/10/2016) 30 capsule 5 Not Taking at Unknown time  . sertraline (ZOLOFT) 25 MG tablet Take 1 tablet (25 mg total) by mouth daily. (Patient not taking: Reported on 09/10/2016) 30 tablet 11 Not Taking at Unknown time    Review of Systems - See HPI for OB specific ROS.   Vitals:  BP 111/60 (BP Location: Left Arm)   Pulse (!) 103   Temp 98.1 F (36.7 C) (Oral)   Resp 18   Ht 5\' 4"  (1.626 m)   Wt 74.4 kg (164 lb)   BMI 28.15 kg/m  Physical Examination: CONSTITUTIONAL: Well-developed, well-nourished female in no acute distress.  HENT:  Normocephalic, atraumatic EYES:  Conjunctivae and EOM are normal. No scleral icterus.  NECK: Normal range of motion, supple, SKIN: Skin is warm and dry. No rash noted. Not diaphoretic. No erythema. No pallor. NEUROLGIC: Alert and oriented to person, place, and time. No gross cranial nerve deficit noted. PSYCHIATRIC: Normal mood and affect. Normal behavior. Normal judgment and thought content. CARDIOVASCULAR: Normal heart rate noted, regular rhythm RESPIRATORY: Effort and breath sounds normal, no problems with respiration noted ABDOMEN: Soft, nontender, nondistended, gravid.  Cervix: pending FFN collection Membranes:intact Fetal Monitoring:Baseline: 125 bpm, Variability: Good {> 6 bpm), Accelerations: Reactive and Decelerations: Absent Tocometer: intermittent contractions  Labs:  No results found for this or any previous visit (from the past 24 hour(s)).  Imaging Studies: No results found.   Assessment and Plan: Patient Active Problem List   Diagnosis Date Noted  . Preterm uterine contractions 08/09/2016  .  Flank pain, acute 07/12/2016  . UTI (urinary tract infection) during pregnancy, second trimester 06/11/2016  . Panic disorder 06/11/2016  . 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 Uterine contractions during pregnancy 05/28/2016  . First trimester screening     1. Concern for preterm labor:  - Labs FFN, wet prep, urinalysis, GC/CT - Continue fetal monitoring - Continuous toco - Check cervix  Cline CoolsBethany E Baldwin [place], MD, MPH

## 2016-09-11 DIAGNOSIS — Z3490 Encounter for supervision of normal pregnancy, unspecified, unspecified trimester: Secondary | ICD-10-CM

## 2016-09-11 DIAGNOSIS — O4702 False labor before 37 completed weeks of gestation, second trimester: Secondary | ICD-10-CM | POA: Diagnosis not present

## 2016-09-11 LAB — COMPREHENSIVE METABOLIC PANEL
ALK PHOS: 142 U/L — AB (ref 38–126)
ALT: 6 U/L — ABNORMAL LOW (ref 14–54)
ANION GAP: 6 (ref 5–15)
AST: 13 U/L — ABNORMAL LOW (ref 15–41)
Albumin: 2.7 g/dL — ABNORMAL LOW (ref 3.5–5.0)
BILIRUBIN TOTAL: 0.4 mg/dL (ref 0.3–1.2)
BUN: 6 mg/dL (ref 6–20)
CALCIUM: 8 mg/dL — AB (ref 8.9–10.3)
CO2: 21 mmol/L — ABNORMAL LOW (ref 22–32)
Chloride: 107 mmol/L (ref 101–111)
Creatinine, Ser: 0.41 mg/dL — ABNORMAL LOW (ref 0.44–1.00)
GFR calc non Af Amer: >60 mL/min (ref >60)
GLUCOSE: 80 mg/dL (ref 65–99)
Potassium: 3.1 mmol/L — ABNORMAL LOW (ref 3.5–5.1)
Sodium: 134 mmol/L — ABNORMAL LOW (ref 135–145)
TOTAL PROTEIN: 6.1 g/dL — AB (ref 6.5–8.1)

## 2016-09-11 LAB — URINALYSIS, ROUTINE W REFLEX MICROSCOPIC
BILIRUBIN URINE: NEGATIVE
Glucose, UA: NEGATIVE mg/dL
HGB URINE DIPSTICK: NEGATIVE
KETONES UR: NEGATIVE mg/dL
Nitrite: POSITIVE — AB
PH: 6 (ref 5.0–8.0)
Protein, ur: 30 mg/dL — AB
Specific Gravity, Urine: 1.016 (ref 1.005–1.030)

## 2016-09-11 LAB — FETAL FIBRONECTIN: Fetal Fibronectin: NEGATIVE

## 2016-09-11 LAB — CHLAMYDIA/NGC RT PCR (ARMC ONLY)
CHLAMYDIA TR: NOT DETECTED
N gonorrhoeae: NOT DETECTED

## 2016-09-11 LAB — LACTATE DEHYDROGENASE: LDH: 98 U/L (ref 98–192)

## 2016-09-11 LAB — WET PREP, GENITAL
Clue Cells Wet Prep HPF POC: NONE SEEN
Sperm: NONE SEEN
TRICH WET PREP: NONE SEEN
YEAST WET PREP: NONE SEEN

## 2016-09-11 NOTE — Discharge Summary (Signed)
26 y.o. [id number] at 7069w4d presenting to triage for rule out preterm labor.  Cervix closed, no preterm labor Wet prep negative FFN negative  A/P: 26 y.o. [id number] at 7669w4d with UTI - UTI: Urine culture pending. Prior culture with E. Coli sensitive to nitrofurantoin. Will send in empiric Macrobid to pharmacy from office EMR. - D/C home with precautions

## 2016-09-13 LAB — URINE CULTURE

## 2016-09-29 ENCOUNTER — Emergency Department
Admission: EM | Admit: 2016-09-29 | Discharge: 2016-09-29 | Disposition: A | Discharge status: Home / Self Care | Payer: Medicaid Other | Attending: Emergency Medicine | Admitting: Emergency Medicine

## 2016-09-29 ENCOUNTER — Encounter: Payer: Self-pay | Admitting: Emergency Medicine

## 2016-09-29 ENCOUNTER — Other Ambulatory Visit: Payer: Self-pay

## 2016-09-29 DIAGNOSIS — O26893 Other specified pregnancy related conditions, third trimester: Secondary | ICD-10-CM | POA: Diagnosis not present

## 2016-09-29 DIAGNOSIS — O2343 Unspecified infection of urinary tract in pregnancy, third trimester: Secondary | ICD-10-CM | POA: Insufficient documentation

## 2016-09-29 DIAGNOSIS — R0602 Shortness of breath: Secondary | ICD-10-CM | POA: Diagnosis not present

## 2016-09-29 DIAGNOSIS — R51 Headache: Secondary | ICD-10-CM | POA: Diagnosis not present

## 2016-09-29 DIAGNOSIS — N390 Urinary tract infection, site not specified: Secondary | ICD-10-CM

## 2016-09-29 DIAGNOSIS — F17210 Nicotine dependence, cigarettes, uncomplicated: Secondary | ICD-10-CM | POA: Insufficient documentation

## 2016-09-29 DIAGNOSIS — Z3A36 36 weeks gestation of pregnancy: Secondary | ICD-10-CM | POA: Insufficient documentation

## 2016-09-29 DIAGNOSIS — R202 Paresthesia of skin: Secondary | ICD-10-CM

## 2016-09-29 LAB — URINALYSIS, COMPLETE (UACMP) WITH MICROSCOPIC
Bilirubin Urine: NEGATIVE
Glucose, UA: NEGATIVE mg/dL
HGB URINE DIPSTICK: NEGATIVE
KETONES UR: NEGATIVE mg/dL
NITRITE: POSITIVE — AB
PROTEIN: NEGATIVE mg/dL
Specific Gravity, Urine: 1.012 (ref 1.005–1.030)
pH: 6 (ref 5.0–8.0)

## 2016-09-29 LAB — CBC
HCT: 30.8 % — ABNORMAL LOW (ref 35.0–47.0)
Hemoglobin: 10.5 g/dL — ABNORMAL LOW (ref 12.0–16.0)
MCH: 28.2 pg (ref 26.0–34.0)
MCHC: 34.2 g/dL (ref 32.0–36.0)
MCV: 82.6 fL (ref 80.0–100.0)
PLATELETS: 234 10*3/uL (ref 150–440)
RBC: 3.73 MIL/uL — AB (ref 3.80–5.20)
RDW: 14.8 % — ABNORMAL HIGH (ref 11.5–14.5)
WBC: 11.9 10*3/uL — AB (ref 3.6–11.0)

## 2016-09-29 LAB — COMPREHENSIVE METABOLIC PANEL
ALT: 7 U/L — AB (ref 14–54)
AST: 17 U/L (ref 15–41)
Albumin: 2.7 g/dL — ABNORMAL LOW (ref 3.5–5.0)
Alkaline Phosphatase: 153 U/L — ABNORMAL HIGH (ref 38–126)
Anion gap: 6 (ref 5–15)
CHLORIDE: 109 mmol/L (ref 101–111)
CO2: 20 mmol/L — AB (ref 22–32)
CREATININE: 0.34 mg/dL — AB (ref 0.44–1.00)
Calcium: 8 mg/dL — ABNORMAL LOW (ref 8.9–10.3)
GFR calc Af Amer: >60 mL/min (ref >60)
GFR calc non Af Amer: >60 mL/min (ref >60)
Glucose, Bld: 98 mg/dL (ref 65–99)
POTASSIUM: 2.9 mmol/L — AB (ref 3.5–5.1)
SODIUM: 135 mmol/L (ref 135–145)
Total Bilirubin: 0.4 mg/dL (ref 0.3–1.2)
Total Protein: 6.3 g/dL — ABNORMAL LOW (ref 6.5–8.1)

## 2016-09-29 MED ORDER — NITROFURANTOIN MONOHYD MACRO 100 MG PO CAPS
100.0000 mg | ORAL_CAPSULE | Freq: Once | ORAL | Status: AC
  Administered 2016-09-29: 100 mg via ORAL
  Filled 2016-09-29: qty 1

## 2016-09-29 MED ORDER — NITROFURANTOIN MONOHYD MACRO 100 MG PO CAPS: 100.0000 mg | ORAL_CAPSULE | Freq: Two times a day (BID) | ORAL | 0 refills | Status: AC

## 2016-09-29 NOTE — ED Triage Notes (Signed)
Pt here with c/o shortness of breath, numbness in face and bilateral hands and feet and "seeing spots". Pt also reports weakness. Pt is [redacted] weeks pregnant, sees [facility].

## 2016-09-29 NOTE — ED Provider Notes (Signed)
[facility] Emergency Department Provider Note  Time seen: 1:49 PM  I have reviewed the triage vital signs and the nursing notes.   HISTORY  Chief Complaint Numbness and Shortness of Breath    HPI Yvonne Shea is a 26 y.o. female approximately [redacted] weeks pregnant who presents to the emergency department with complaints of tingling sensations in her hands and face some mild shortness of breath. States her symptoms have been intermittent over the past 2-3 days but more constant today. She called her OB who referred her to the emergency department for further evaluation. Patient also states a mild headache. Denies any weakness or numbness of any arm or leg or face. Denies any history of preeclampsia with prior pregnancies. Denies any abdominal pain, cramping vaginal bleeding or discharge. Patient does state intermittent shortness of breath although denies any currently. Denies any chest pain. Patient is scheduled for a C-section in approximately 2 weeks.  Past Medical History:  Diagnosis Date  . Depression   . Mental disorder     Patient Active Problem List   Diagnosis Date Noted  . Pregnancy 09/11/2016  . Preterm uterine contractions 08/09/2016  . Flank pain, acute 07/12/2016  . UTI (urinary tract infection) during pregnancy, second trimester 06/11/2016  . Panic disorder 06/11/2016  . 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 Uterine contractions during pregnancy 05/28/2016  . First trimester screening     Past Surgical History:  Procedure Laterality Date  . APPENDECTOMY    . CESAREAN SECTION    . CHOLECYSTECTOMY    . DILATION AND CURETTAGE OF UTERUS      Prior to Admission medications   Medication Sig Start Date End Date Taking? Authorizing Provider  sertraline (ZOLOFT) 25 MG tablet Take 1 tablet (25 mg total) by mouth daily. Patient not taking: Reported on 09/10/2016 06/15/16   Ward, Elenora Fender, MD    Allergies  Allergen Reactions  .  Sulfa Antibiotics Nausea Only and Nausea And Vomiting    No family history on file.  Social History Social History  Substance Use Topics  . Smoking status: Current Every Day Smoker    Packs/day: 1.00    Types: Cigarettes  . Smokeless tobacco: Never Used  . Alcohol use No    Review of Systems Constitutional: Negative for fever. Cardiovascular: Negative for chest pain. Respiratory: Intermittent shortness of breath 2-3 days. Gastrointestinal: Negative for abdominal pain, vomiting and diarrhea. Genitourinary: Negative for dysuria. Musculoskeletal: Negative for back pain. Skin: Negative for rash. Neurological: Negative for . Negative for focal weakness or numbness. Positive for paresthesias of the face and bilateral arms. All other ROS negative  ____________________________________________   PHYSICAL EXAM:  VITAL SIGNS: ED Triage Vitals [09/29/16 1208]  Enc Vitals Group     BP 115/74     Pulse Rate (!) 108     Resp 17     Temp 99 F (37.2 C)     Temp Source Oral     SpO2 98 %     Weight 173 lb (78.5 kg)     Height 5\' 4"  (1.626 m)     Head Circumference      Peak Flow      Pain Score 0     Pain Loc      Pain Edu?      Excl. in GC?     Constitutional: Alert and oriented. Well appearing and in no distress. Eyes: Normal exam ENT   Head: Normocephalic and atraumatic.  Mouth/Throat: Mucous membranes are moist. Cardiovascular: Normal rate, regular rhythm. No murmur Respiratory: Normal respiratory effort without tachypnea nor retractions. Breath sounds are clear  Gastrointestinal: Soft and nontender. No distention.   Musculoskeletal: Nontender with normal range of motion in all extremities. Neurologic:  Normal speech and language. No gross focal neurologic deficits. Cranial nerves intact. Equal grip strength. No pronator drift. 5/5 motor in all extremities. Skin:  Skin is warm, dry and intact.  Psychiatric: Mood and affect are normal.    ____________________________________________    INITIAL IMPRESSION / ASSESSMENT AND PLAN / ED COURSE  Pertinent labs & imaging results that were available during my care of the patient were reviewed by me and considered in my medical decision making (see chart for details).  Patient presents to the emergency department with paresthesias of both sides of her face as well as both hands. Overall the patient appears well. She does state a headache for the past several days. Denies any abdominal pain, contractions, vaginal bleeding or fluid leakage. Patient's workup has been largely normal/baseline besides a urinary tract infection with nitrite positive urine. We will place the patient on Macrobid. I discussed the patient's results with her OB/GYN Dr. Elesa MassedWard. She will see the patient in the office next week. Patient's LFTs are normal. Platelet count is normal. Blood pressure is normal. Negative for protein in her urine.  ____________________________________________   FINAL CLINICAL IMPRESSION(S) / ED DIAGNOSES  Urinary tract infection Paresthesias    Minna AntisPaduchowski, Aren Cherne, MD 09/29/16 1355

## 2016-10-01 LAB — URINE CULTURE: Culture: >=100000 — AB

## 2016-10-03 ENCOUNTER — Observation Stay
Admission: EM | Admit: 2016-10-03 | Discharge: 2016-10-03 | Disposition: A | Discharge status: Home / Self Care | Payer: Medicaid Other | Attending: Obstetrics and Gynecology | Admitting: Obstetrics and Gynecology

## 2016-10-03 DIAGNOSIS — F17210 Nicotine dependence, cigarettes, uncomplicated: Secondary | ICD-10-CM | POA: Diagnosis not present

## 2016-10-03 DIAGNOSIS — F419 Anxiety disorder, unspecified: Secondary | ICD-10-CM | POA: Insufficient documentation

## 2016-10-03 DIAGNOSIS — F329 Major depressive disorder, single episode, unspecified: Secondary | ICD-10-CM | POA: Diagnosis not present

## 2016-10-03 DIAGNOSIS — Z3A37 37 weeks gestation of pregnancy: Secondary | ICD-10-CM | POA: Diagnosis not present

## 2016-10-03 DIAGNOSIS — Z79899 Other long term (current) drug therapy: Secondary | ICD-10-CM | POA: Diagnosis not present

## 2016-10-03 DIAGNOSIS — Z882 Allergy status to sulfonamides status: Secondary | ICD-10-CM | POA: Diagnosis not present

## 2016-10-03 DIAGNOSIS — O99333 Smoking (tobacco) complicating pregnancy, third trimester: Secondary | ICD-10-CM | POA: Diagnosis not present

## 2016-10-03 DIAGNOSIS — O471 False labor at or after 37 completed weeks of gestation: Principal | ICD-10-CM | POA: Insufficient documentation

## 2016-10-03 DIAGNOSIS — O479 False labor, unspecified: Secondary | ICD-10-CM | POA: Diagnosis present

## 2016-10-03 DIAGNOSIS — O99343 Other mental disorders complicating pregnancy, third trimester: Secondary | ICD-10-CM | POA: Insufficient documentation

## 2016-10-03 DIAGNOSIS — O34219 Maternal care for unspecified type scar from previous cesarean delivery: Secondary | ICD-10-CM | POA: Diagnosis not present

## 2016-10-03 HISTORY — DX: Anxiety disorder, unspecified: F41.9

## 2016-10-03 LAB — FETAL FIBRONECTIN: FETAL FIBRONECTIN: NEGATIVE

## 2016-10-03 MED ORDER — TERBUTALINE SULFATE 1 MG/ML IJ SOLN
0.2500 mg | Freq: Once | INTRAMUSCULAR | Status: AC
  Administered 2016-10-03: 0.25 mg via SUBCUTANEOUS

## 2016-10-03 MED ORDER — TERBUTALINE SULFATE 1 MG/ML IJ SOLN
INTRAMUSCULAR | Status: AC
  Administered 2016-10-03: 0.25 mg via SUBCUTANEOUS
  Filled 2016-10-03: qty 1

## 2016-10-03 NOTE — Discharge Summary (Signed)
Yvonne Shea, Yvonne Austinhomas J, MD  Obstetrics    [] Hide copied text [] Hover for attribution information Patient ID: Yvonne ShieldsMariah Shea, female   DOB: 03-Mar-1991, 26 y.o.   MRN: [id number]  Yvonne ShieldsMariah Shea is a 26 y.o. female. She is at 3160w6d gestation. No LMP recorded. Patient is pregnant. Estimated Date of Delivery: 10/18/16  [facility]  Chief complaint: Painful Utx ctx . Prior h/o c/s . Scheduled for a repeat c/s in 10 days   S: as above . No LOF , no vag bleeding   Maternal Medical History:       Past Medical History:  Diagnosis Date  . Anxiety   . Depression   . Mental disorder          Past Surgical History:  Procedure Laterality Date  . APPENDECTOMY    . CESAREAN SECTION    . CHOLECYSTECTOMY    . DILATION AND CURETTAGE OF UTERUS          Allergies  Allergen Reactions  . Sulfa Antibiotics Nausea Only and Nausea And Vomiting           Prior to Admission medications   Medication Sig Start Date End Date Taking? Authorizing Provider  ciprofloxacin (CIPRO) 500 MG tablet Take 500 mg by mouth 2 (two) times daily.   Yes [provider]  ferrous sulfate 325 (65 FE) MG tablet Take 325 mg by mouth daily with breakfast.   Yes [provider]  nitrofurantoin, macrocrystal-monohydrate, (MACROBID) 100 MG capsule Take 1 capsule (100 mg total) by mouth 2 (two) times daily. Patient not taking: Reported on 10/03/2016 09/29/16 10/06/16  Minna AntisPaduchowski, Kevin, MD  sertraline (ZOLOFT) 25 MG tablet Take 1 tablet (25 mg total) by mouth daily. Patient not taking: Reported on 09/10/2016 06/15/16   Ward, Elenora Fenderhelsea C, MD     Social History: She  reports that she has been smoking Cigarettes.  She has been smoking about 1.00 pack per day. She has never used smokeless tobacco. She reports that she does not drink alcohol or use drugs.  Family History: family history is not on file.  no history of gyn cancers  Review of Systems: A  full review of systems was performed and negative except as noted in the HPI.     O:  BP 121/75 (BP Location: Right Arm)   Pulse 81   Temp 97.9 F (36.6 C) (Oral)   Resp 20   Ht 5\' 4"  (1.626 m)   Wt 78.5 kg (173 lb)   BMI 29.70 kg/m       Results for orders placed or performed during the hospital encounter of 10/03/16 (from the past 48 hour(s))  Fetal fibronectin   Collection Time: 10/03/16  5:14 PM  Result Value Ref Range   Fetal Fibronectin NEGATIVE NEGATIVE   Appearance, FETFIB CLEAR CLEAR   Constitutional: NAD, AAOx3  HE/ENT: extraocular movements grossly intact, moist mucous membranes CV: RRR PULM: nl respiratory effort, CTABL                                         Abd: gravid, non-tender, non-distended, soft  Ext: Non-tender, Nonedmeatous                     Psych: mood appropriate, speech normal Pelvic TFT / 50% /vtx _2 NST:  Baseline: 140 Variability: moderate Accelerations present x >2 Decelerations absent REACTIVE NST  CTX q 3-5 , dissipated post terbutaline x1      A/P: 26 y.o. [redacted]w[redacted]d here for antenatal surveillance for uterine ctx   Labor: not present.   Fetal Wellbeing: Reassuring Cat 1 tracing.  Reactive NST   D/c home stable, precautions reviewed, follow-up as scheduled.   ----- Jennell Corner, MD Attending Obstetrician and Gynecologist [facility], Department of OB/GYN [facility]     Electronically signed by Prescott Truex, Yvonne Austin, MD at 10/03/2016 8:18 PM      ED to Hosp-Admission (Current) on 10/03/2016

## 2016-10-03 NOTE — Discharge Instructions (Signed)
Cesarean Delivery °Cesarean birth, or cesarean delivery, is the surgical delivery of a baby through an incision in the abdomen and the uterus. This may be referred to as a C-section. This procedure may be scheduled ahead of time, or it may be done in an emergency situation. °Tell a health care provider about: °· Any allergies you have. °· All medicines you are taking, including vitamins, herbs, eye drops, creams, and over-the-counter medicines. °· Any problems you or family members have had with anesthetic medicines. °· Any blood disorders you have. °· Any surgeries you have had. °· Any medical conditions you have. °· Whether you or any members of your family have a history of deep vein thrombosis (DVT) or pulmonary embolism (PE). °What are the risks? °Generally, this is a safe procedure. However, problems may occur, including: °· Infection. °· Bleeding. °· Allergic reactions to medicines. °· Damage to other structures or organs. °· Blood clots. °· Injury to your baby. ° °What happens before the procedure? °· Follow instructions from your health care provider about eating or drinking restrictions. °· Follow instructions from your health care provider about bathing before your procedure to help reduce your risk of infection. °· If you know that you are going to have a cesarean delivery, do not shave your pubic area. Shaving before the procedure may increase your risk of infection. °· Ask your health care provider about: °? Changing or stopping your regular medicines. This is especially important if you are taking diabetes medicines or blood thinners. °? Your pain management plan. This is especially important if you plan to breastfeed your baby. °? How long you will be in the hospital after the procedure. °? Any concerns you may have about receiving blood products if you need them during the procedure. °? Cord blood banking, if you plan to collect your baby’s umbilical cord blood. °· You may also want to ask your  health care provider: °? Whether you will be able to hold or breastfeed your baby while you are still in the operating room. °? Whether your baby can stay with you immediately after the procedure and during your recovery. °? Whether a family member or a person of your choice can go with you into the operating room and stay with you during the procedure, immediately after the procedure, and during your recovery. °· Plan to have someone drive you home when you are discharged from the hospital. °What happens during the procedure? °· Fetal monitors will be placed on your abdomen to monitor your heart rate and your baby's heart rate. °· Depending on the reason for your cesarean delivery, you may have a physical exam or additional testing, such as an ultrasound. °· An IV tube will be inserted into one of your veins. °· You may have your blood or urine tested. °· You will be given antibiotic medicine to help prevent infection. °· You may be given a special warming gown to wear to keep your temperature stable. °· Hair may be removed from your pubic area. °· The skin of your pubic area and lower abdomen will be cleaned with a germ-killing solution (antiseptic). °· A catheter may be inserted into your bladder through your urethra. This drains your urine during the procedure. °· You may be given one or more of the following: °? A medicine to numb the area (local anesthetic). °? A medicine to make you fall asleep (general anesthetic). °? A medicine (regional anesthetic) that is injected into your back or through a small   thin tube placed in your back (spinal anesthetic or epidural anesthetic). This numbs everything below the injection site and allows you to stay awake during your procedure. If this makes you feel nauseous, tell your health care provider. Medicines will be available to help reduce any nausea you may feel.  An incision will be made in your abdomen, and then in your uterus.  If you are awake during your  procedure, you may feel tugging and pulling in your abdomen, but you should not feel pain. If you feel pain, tell your health care provider immediately.  Your baby will be removed from your uterus. You may feel more pressure or pushing while this happens.  Immediately after birth, your baby will be dried and kept warm. You may be able to hold and breastfeed your baby. The umbilical cord may be clamped and cut during this time.  Your placenta will be removed from your uterus.  Your incisions will be closed with stitches (sutures). Staples, skin glue, or adhesive strips may also be applied to the incision in your abdomen.  Bandages (dressings) will be placed over the incision in your abdomen. The procedure may vary among health care providers and hospitals. What happens after the procedure?  Your blood pressure, heart rate, breathing rate, and blood oxygen level will be monitored often until the medicines you were given have worn off.  You may continue to receive fluids and medicines through an IV tube.  You will have some pain. Medicines will be available to help control your pain.  To help prevent blood clots: ? You may be given medicines. ? You may have to wear compression stockings or devices. ? You will be encouraged to walk around when you are able.  Hospital staff will encourage and support bonding with your baby. Your hospital may allow you and your baby to stay in the same room (rooming in) during your hospital stay to encourage successful breastfeeding.  You may be encouraged to cough and breathe deeply often. This helps to prevent lung problems.  If you have a catheter draining your urine, it will be removed as soon as possible after your procedure. This information is not intended to replace advice given to you by your health care provider. Make sure you discuss any questions you have with your health care provider. Document Released: 03/13/2005 Document Revised: 08/19/2015  Document Reviewed: 12/22/2014 Elsevier Interactive Patient Education  2017 [employer] LABOR: When contractions begin, you should start to time them from the beginning of one contraction to the beginning of the next.  When contractions are 5-10 minutes apart or less and have been regular for at least an hour, you should call your health care provider.  Notify your doctor if any of the following occur: 1. Bleeding from the vagina 7. Sudden, constant, or occasional abdominal pain  2. Pain or burning when urinating 8. Sudden gushing of fluid from the vagina (with or without continued leaking)  3. Chills or fever 9. Fainting spells, "black outs" or loss of consciousness  4. Increase in vaginal discharge 10. Severe or continued nausea or vomiting  5. Pelvic pressure (sudden increase) 11. Blurring of vision or spots before the eyes  6. Baby moving less than usual 12. Leaking of fluid    FETAL KICK COUNT: Lie on your left side for one hour after a meal, and count the number of times your baby kicks. If it is less than 5 times, get up, move around and drink some juice.  Repeat the test 30 minutes later. If it is still less than 5 kicks in an hour, notify your doctor.

## 2016-10-03 NOTE — OB Triage Note (Signed)
Pt presents c/o ctx that started about an hour ago that were about 7 mins apart. Pt rates pain 10/10. Pt states she has been nauseous. Pt denies any bleeding or LOF. Reports positive fetal movement. Will continue to monitor.

## 2016-10-03 NOTE — Progress Notes (Signed)
Patient ID: Yvonne Shea, female   DOB: Sep 15, 1990, 26 y.o.   MRN: [id number]  Yvonne ShieldsMariah Shea is a 26 y.o. female. She is at 5631w6d gestation. No LMP recorded. Patient is pregnant. Estimated Date of Delivery: 10/18/16  [facility]  Chief complaint: Painful Utx ctx . Prior h/o c/s . Scheduled for a repeat c/s in 10 days   S: as above . No LOF , no vag bleeding   Maternal Medical History:   Past Medical History:  Diagnosis Date  . Anxiety   . Depression   . Mental disorder     Past Surgical History:  Procedure Laterality Date  . APPENDECTOMY    . CESAREAN SECTION    . CHOLECYSTECTOMY    . DILATION AND CURETTAGE OF UTERUS      Allergies  Allergen Reactions  . Sulfa Antibiotics Nausea Only and Nausea And Vomiting    Prior to Admission medications   Medication Sig Start Date End Date Taking? Authorizing Provider  ciprofloxacin (CIPRO) 500 MG tablet Take 500 mg by mouth 2 (two) times daily.   Yes [provider]  ferrous sulfate 325 (65 FE) MG tablet Take 325 mg by mouth daily with breakfast.   Yes [provider]  nitrofurantoin, macrocrystal-monohydrate, (MACROBID) 100 MG capsule Take 1 capsule (100 mg total) by mouth 2 (two) times daily. Patient not taking: Reported on 10/03/2016 09/29/16 10/06/16  Minna AntisPaduchowski, Kevin, MD  sertraline (ZOLOFT) 25 MG tablet Take 1 tablet (25 mg total) by mouth daily. Patient not taking: Reported on 09/10/2016 06/15/16   Ward, Elenora Fenderhelsea C, MD     Social History: She  reports that she has been smoking Cigarettes.  She has been smoking about 1.00 pack per day. She has never used smokeless tobacco. She reports that she does not drink alcohol or use drugs.  Family History: family history is not on file.  no history of gyn cancers  Review of Systems: A full review of systems was performed and negative except as noted in the HPI.     O:  BP 121/75 (BP Location: Right Arm)   Pulse 81   Temp 97.9 F (36.6 C)  (Oral)   Resp 20   Ht 5\' 4"  (1.626 m)   Wt 78.5 kg (173 lb)   BMI 29.70 kg/m  Results for orders placed or performed during the hospital encounter of 10/03/16 (from the past 48 hour(s))  Fetal fibronectin   Collection Time: 10/03/16  5:14 PM  Result Value Ref Range   Fetal Fibronectin NEGATIVE NEGATIVE   Appearance, FETFIB CLEAR CLEAR     Constitutional: NAD, AAOx3  HE/ENT: extraocular movements grossly intact, moist mucous membranes CV: RRR PULM: nl respiratory effort, CTABL     Abd: gravid, non-tender, non-distended, soft      Ext: Non-tender, Nonedmeatous   Psych: mood appropriate, speech normal Pelvic TFT / 50% /vtx _2 NST:  Baseline: 140 Variability: moderate Accelerations present x >2 Decelerations absent REACTIVE NST  CTX q 3-5 , dissipated post terbutaline x1      A/P: 26 y.o. 6731w6d here for antenatal surveillance for uterine ctx   Labor: not present.   Fetal Wellbeing: Reassuring Cat 1 tracing.  Reactive NST   D/c home stable, precautions reviewed, follow-up as scheduled.   ----- Jennell Cornerhomas Abrar Koone, MD Attending Obstetrician and Gynecologist [facility], Department of OB/GYN [facility]

## 2016-10-03 NOTE — Progress Notes (Signed)
MEDICATION RELATED CONSULT NOTE - INITIAL   Pharmacy Consult for ED CULTURE RESULTS  Allergies  Allergen Reactions  . Sulfa Antibiotics Nausea Only and Nausea And Vomiting   Patient Measurements: Height: 5\' 4"  (162.6 cm) Weight: 173 lb (78.5 kg) IBW/kg (Calculated) : 54.7    Microbiology: Recent Results (from the past 720 hour(s))  Culture, Urine     Status: Abnormal   Collection Time: 09/10/16 10:41 PM  Result Value Ref Range Status   Specimen Description URINE, RANDOM  Final   Special Requests NONE  Final   Culture >=100,000 COLONIES/mL ESCHERICHIA COLI (A)  Final   Report Status 09/13/2016 FINAL  Final   Organism ID, Bacteria ESCHERICHIA COLI (A)  Final      Susceptibility   Escherichia coli - MIC*    AMPICILLIN >=32 RESISTANT Resistant     CEFAZOLIN <=4 SENSITIVE Sensitive     CEFTRIAXONE <=1 SENSITIVE Sensitive     CIPROFLOXACIN <=0.25 SENSITIVE Sensitive     GENTAMICIN 8 INTERMEDIATE Intermediate     IMIPENEM <=0.25 SENSITIVE Sensitive     NITROFURANTOIN 64 INTERMEDIATE Intermediate     TRIMETH/SULFA >=320 RESISTANT Resistant     AMPICILLIN/SULBACTAM 16 INTERMEDIATE Intermediate     PIP/TAZO <=4 SENSITIVE Sensitive     Extended ESBL NEGATIVE Sensitive     * >=100,000 COLONIES/mL ESCHERICHIA COLI  Wet prep, genital     Status: Abnormal   Collection Time: 09/10/16 11:35 PM  Result Value Ref Range Status   Yeast Wet Prep HPF POC NONE SEEN NONE SEEN Final   Trich, Wet Prep NONE SEEN NONE SEEN Final   Clue Cells Wet Prep HPF POC NONE SEEN NONE SEEN Final   WBC, Wet Prep HPF POC RARE (A) NONE SEEN Final   Sperm NONE SEEN  Final  Chlamydia/NGC rt PCR ([facility] only)     Status: None   Collection Time: 09/10/16 11:35 PM  Result Value Ref Range Status   Specimen source GC/Chlam URINE, RANDOM  Final   Chlamydia Tr NOT DETECTED NOT DETECTED Final   N gonorrhoeae NOT DETECTED NOT DETECTED Final    Comment: (NOTE) 100  This methodology has not been evaluated in pregnant  women or in 200  patients with a history of hysterectomy. 300 400  This methodology will not be performed on patients less than 2214  years of age.   Urine Culture     Status: Abnormal   Collection Time: 09/29/16 12:34 PM  Result Value Ref Range Status   Specimen Description URINE, RANDOM  Final   Special Requests NONE  Final   Culture >=100,000 COLONIES/mL ESCHERICHIA COLI (A)  Final   Report Status 10/01/2016 FINAL  Final   Organism ID, Bacteria ESCHERICHIA COLI (A)  Final      Susceptibility   Escherichia coli - MIC*    AMPICILLIN >=32 RESISTANT Resistant     CEFAZOLIN <=4 SENSITIVE Sensitive     CEFTRIAXONE <=1 SENSITIVE Sensitive     CIPROFLOXACIN <=0.25 SENSITIVE Sensitive     GENTAMICIN 8 INTERMEDIATE Intermediate     IMIPENEM <=0.25 SENSITIVE Sensitive     NITROFURANTOIN 64 INTERMEDIATE Intermediate     TRIMETH/SULFA >=320 RESISTANT Resistant     AMPICILLIN/SULBACTAM 16 INTERMEDIATE Intermediate     PIP/TAZO <=4 SENSITIVE Sensitive     Extended ESBL NEGATIVE Sensitive     * >=100,000 COLONIES/mL ESCHERICHIA COLI    Assessment: 26 yo pregnant female seen in ED for UTI symptoms. Patient was discharged on Macrobid. Urine culture  results showing E.Coli >100,000, with nitrofurantion  showing intermediate coverage.   Plan:  Patients OB/GYN was contacted and UCx results were faxed. Pharmacy consulted receipt of information on 7/9.  According to Care Everywhere, OB/GYN contacted patient and prescribed new Abx on 7/9.   Gardner Candle, PharmD, BCPS Clinical Pharmacist 10/03/2016 1:34 PM

## 2016-10-08 ENCOUNTER — Observation Stay
Admission: EM | Admit: 2016-10-08 | Discharge: 2016-10-08 | Disposition: A | Discharge status: Home / Self Care | Payer: Medicaid Other | Attending: Obstetrics and Gynecology | Admitting: Obstetrics and Gynecology

## 2016-10-08 DIAGNOSIS — F329 Major depressive disorder, single episode, unspecified: Secondary | ICD-10-CM | POA: Insufficient documentation

## 2016-10-08 DIAGNOSIS — Z9889 Other specified postprocedural states: Secondary | ICD-10-CM | POA: Insufficient documentation

## 2016-10-08 DIAGNOSIS — Z3A38 38 weeks gestation of pregnancy: Secondary | ICD-10-CM | POA: Diagnosis not present

## 2016-10-08 DIAGNOSIS — O99343 Other mental disorders complicating pregnancy, third trimester: Secondary | ICD-10-CM | POA: Diagnosis not present

## 2016-10-08 DIAGNOSIS — Z0379 Encounter for other suspected maternal and fetal conditions ruled out: Principal | ICD-10-CM | POA: Insufficient documentation

## 2016-10-08 DIAGNOSIS — Z9049 Acquired absence of other specified parts of digestive tract: Secondary | ICD-10-CM | POA: Insufficient documentation

## 2016-10-08 DIAGNOSIS — F17210 Nicotine dependence, cigarettes, uncomplicated: Secondary | ICD-10-CM | POA: Insufficient documentation

## 2016-10-08 DIAGNOSIS — Z882 Allergy status to sulfonamides status: Secondary | ICD-10-CM | POA: Diagnosis not present

## 2016-10-08 DIAGNOSIS — F419 Anxiety disorder, unspecified: Secondary | ICD-10-CM | POA: Insufficient documentation

## 2016-10-08 DIAGNOSIS — O99333 Smoking (tobacco) complicating pregnancy, third trimester: Secondary | ICD-10-CM | POA: Diagnosis not present

## 2016-10-08 NOTE — Discharge Summary (Signed)
Yvonne Shea is a 26 y.o. female. She is at 623w4d gestation. No LMP recorded. Patient is pregnant. Estimated Date of Delivery: 10/18/16  [facility]: [facility]   Location: Onset/timing: Duration: Quality:  Severity: Aggravating or alleviating conditions: Associated signs/symptoms: Context:  S: Resting comfortably. no CTX, no VB.no LOF,  Active fetal movement.   Maternal Medical History:   Past Medical History:  Diagnosis Date  . Anxiety   . Depression   . Mental disorder     Past Surgical History:  Procedure Laterality Date  . APPENDECTOMY    . CESAREAN SECTION    . CHOLECYSTECTOMY    . DILATION AND CURETTAGE OF UTERUS      Allergies  Allergen Reactions  . Sulfa Antibiotics Nausea Only and Nausea And Vomiting    Prior to Admission medications   Medication Sig Start Date End Date Taking? Authorizing Provider  ciprofloxacin (CIPRO) 500 MG tablet Take 500 mg by mouth 2 (two) times daily.   Yes [provider]  ferrous sulfate 325 (65 FE) MG tablet Take 325 mg by mouth daily with breakfast.   Yes [provider]  sertraline (ZOLOFT) 25 MG tablet Take 1 tablet (25 mg total) by mouth daily. Patient not taking: Reported on 09/10/2016 06/15/16   Ward, Elenora Fenderhelsea C, MD     Social History: She  reports that she has been smoking Cigarettes.  She has been smoking about 1.00 pack per day. She has never used smokeless tobacco. She reports that she does not drink alcohol or use drugs.  Family History: family history is not on file.  no history of gyn cancers  Review of Systems: A full review of systems was performed and negative except as noted in the HPI.     O:  There were no vitals taken for this visit. No results found for this or any previous visit (from the past 48 hour(s)).   Constitutional: NAD, AAOx3  HE/ENT: extraocular movements grossly intact, moist mucous membranes CV: RRR PULM: nl respiratory effort,  CTABL     Abd: gravid, non-tender, non-distended, soft      Ext: Non-tender, Nonedmeatous   Psych: mood appropriate, speech normal Pelvic neg Nitrazine  NST: Reactive  Baseline:  Variability: moderate Accelerations present x >2 Decelerations absent Time 20mins    A/P: 26 y.o. 793w4d here for antenatal surveillance for LOF , neg exam   Labor: not present.   Fetal Wellbeing: Reassuring Cat 1 tracing.  Reactive NST   D/c home stable, precautions reviewed, follow-up as scheduled.   ----- Jennell Cornerhomas Schermerhorn, MD Attending Obstetrician and Gynecologist [facility], Department of OB/GYN [facility]

## 2016-10-08 NOTE — Progress Notes (Signed)
Patient discharged home in stable condition, discharge instructions gone over with patient and family verbalized understanding.

## 2016-10-08 NOTE — OB Triage Note (Signed)
Patient here for complaints of Leaking fluid with foul smell

## 2016-10-12 ENCOUNTER — Encounter
Admission: RE | Admit: 2016-10-12 | Discharge: 2016-10-12 | Disposition: A | Discharge status: Home / Self Care | Payer: Medicaid Other | Source: Ambulatory Visit | Attending: Obstetrics and Gynecology | Admitting: Obstetrics and Gynecology

## 2016-10-12 LAB — CBC
HCT: 35.1 % (ref 35.0–47.0)
Hemoglobin: 11.7 g/dL — ABNORMAL LOW (ref 12.0–16.0)
MCH: 28 pg (ref 26.0–34.0)
MCHC: 33.3 g/dL (ref 32.0–36.0)
MCV: 84.1 fL (ref 80.0–100.0)
PLATELETS: 236 10*3/uL (ref 150–440)
RBC: 4.17 MIL/uL (ref 3.80–5.20)
RDW: 15.7 % — AB (ref 11.5–14.5)
WBC: 11.9 10*3/uL — AB (ref 3.6–11.0)

## 2016-10-12 NOTE — Patient Instructions (Signed)
  Your procedure is scheduled on: 10/13/16 Report to ER then to Labor and Delivery at 5:30  Remember: Instructions that are not followed completely may result in serious medical risk, up to and including death, or upon the discretion of your surgeon and anesthesiologist your surgery may need to be rescheduled.    __x__ 1. Do not eat food or drink liquids after midnight. No gum chewing or hard candies.     ____ 2. No Alcohol for 24 hours before or after surgery.   __x__ 3. Do Not Smoke For 24 Hours Prior to Your Surgery.   ____ 4. Bring all medications with you on the day of surgery if instructed.    ____ 5. Notify your doctor if there is any change in your medical condition     (cold, fever, infections).       Do not wear jewelry, make-up, hairpins, clips or nail polish.  Do not wear lotions, powders, or perfumes. You may wear deodorant.  Do not shave 48 hours prior to surgery. Men may shave face and neck.  Do not bring valuables to the hospital.    [facility] is not responsible for any belongings or valuables.               Contacts, dentures or bridgework may not be worn into surgery.  Leave your suitcase in the car. After surgery it may be brought to your room.  For patients admitted to the hospital, discharge time is determined by your                treatment team.   Patients discharged the day of surgery will not be allowed to drive home.   Please read over the following fact sheets that you were given:      ____ Take these medicines the morning of surgery with A SIP OF WATER:   1. none  2.   3.   4.  5.  6.  ____ Fleet Enema (as directed)   __x__ Use CHG Soap as directed  ____ Use inhalers on the day of surgery  ____ Stop metformin 2 days prior to surgery    ____ Take 1/2 of usual insulin dose the night before surgery and none on the morning of surgery.   ____ Stop Coumadin/Plavix/aspirin on   ____ Stop Anti-inflammatories on    ____ Stop supplements until  after surgery.    ____ Bring C-Pap to the hospital.

## 2016-10-12 NOTE — H&P (Signed)
Yvonne Shea is a 26 y.o. female presenting for elective repeat  EDC7/25/18. 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  Positive tox screen for MJ 05/2016  Screening results and needs:  NOB:   MBT   AB Negative Ab screen  Positive Anti-D Pap 03/29/16 neg  HIV  Hep B/RPR Negative/Non reactive  Rubella  Immune VZV Immune  Aneuploidy:   First trimester: (Did they do cffDNA or NT/blood draw?)  Informaseq:   NT: CRL to large (to late)  Second trimester (AFP/tetra): Declined    28 weeks:  Blood consent:Signed 07/19/16  Hgb: 11.4   Platelet Count:287 Glucola: 115 Rhogam: given 08/07/16  36 weeks:   GBS  POSITIVE:   G/C: Neg/Neg   Hgb: 10.6  Last US: 05/24/16: Single, viable IUP, S=D FHT= 134 pbm Cx closed= 5.1cm Bilateral Ovs imaged appear WNL                  05/30/16:   Cervical length 4.4cm no funneling  08/11/16-vertex, sp lt, plac ant, [id number] @ 53%, AFI-153 mm  @ 54%  Immunization:    Flu in season - N/A  Tdap at 27-36 weeks - given 08/07/16  Social: no  changes  Contraception Plan: DOES NOT want BTL  Feeding Plan:   Past Medical History:  Diagnosis Date  . Anxiety   . Depression   . Mental disorder    Past Surgical History:  Procedure Laterality Date  . APPENDECTOMY    . CESAREAN SECTION    . CHOLECYSTECTOMY    . DILATION AND CURETTAGE OF UTERUS     Family History: family history is not on file. Social History:  reports that she has been smoking Cigarettes.  She has been smoking about 1.00 pack per day. She has never used smokeless tobacco. She reports that she does not drink alcohol or use drugs.     MROS History   There were no vitals taken for this visit. Exam   lungs CTA  CV RRR abd gravid  Physical  Exam  Prenatal labs: ABO, Rh: --/--/AB NEG (03/04 2309) Antibody: NEG (03/04 2309) Rubella:  imm , varicella imm  RPR:   neg HBsAg:   neg HIV:   neg GBS:   Positive  Assessment/Plan: Scheduled for repeat LTCS on 10/13/16 Risks discussed with pt  All questions answered    Ihor Austinhomas J Schermerhorn 10/12/2016, 8:55 AM

## 2016-10-13 ENCOUNTER — Inpatient Hospital Stay: Payer: Medicaid Other | Admitting: Anesthesiology

## 2016-10-13 ENCOUNTER — Inpatient Hospital Stay
Admission: RE | Admit: 2016-10-13 | Discharge: 2016-10-15 | DRG: 765 | Disposition: A | Discharge status: Home / Self Care | Payer: Medicaid Other | Source: Ambulatory Visit | Attending: Obstetrics and Gynecology | Admitting: Obstetrics and Gynecology

## 2016-10-13 ENCOUNTER — Encounter
Admission: RE | Disposition: A | Discharge status: Home / Self Care | Payer: Self-pay | Source: Ambulatory Visit | Attending: Obstetrics and Gynecology

## 2016-10-13 DIAGNOSIS — F17210 Nicotine dependence, cigarettes, uncomplicated: Secondary | ICD-10-CM | POA: Diagnosis present

## 2016-10-13 DIAGNOSIS — O99334 Smoking (tobacco) complicating childbirth: Secondary | ICD-10-CM | POA: Diagnosis present

## 2016-10-13 DIAGNOSIS — O34211 Maternal care for low transverse scar from previous cesarean delivery: Secondary | ICD-10-CM | POA: Diagnosis present

## 2016-10-13 DIAGNOSIS — F339 Major depressive disorder, recurrent, unspecified: Secondary | ICD-10-CM | POA: Diagnosis present

## 2016-10-13 DIAGNOSIS — O99824 Streptococcus B carrier state complicating childbirth: Secondary | ICD-10-CM | POA: Diagnosis present

## 2016-10-13 DIAGNOSIS — Z3A39 39 weeks gestation of pregnancy: Secondary | ICD-10-CM

## 2016-10-13 DIAGNOSIS — O99344 Other mental disorders complicating childbirth: Secondary | ICD-10-CM | POA: Diagnosis present

## 2016-10-13 DIAGNOSIS — Z9889 Other specified postprocedural states: Secondary | ICD-10-CM

## 2016-10-13 DIAGNOSIS — Z98891 History of uterine scar from previous surgery: Secondary | ICD-10-CM

## 2016-10-13 LAB — BASIC METABOLIC PANEL
Anion gap: 7 (ref 5–15)
BUN: 10 mg/dL (ref 6–20)
CHLORIDE: 108 mmol/L (ref 101–111)
CO2: 20 mmol/L — AB (ref 22–32)
CREATININE: 0.49 mg/dL (ref 0.44–1.00)
Calcium: 8.4 mg/dL — ABNORMAL LOW (ref 8.9–10.3)
GFR calc Af Amer: >60 mL/min (ref >60)
GFR calc non Af Amer: >60 mL/min (ref >60)
GLUCOSE: 75 mg/dL (ref 65–99)
Potassium: 3.7 mmol/L (ref 3.5–5.1)
SODIUM: 135 mmol/L (ref 135–145)

## 2016-10-13 LAB — TYPE AND SCREEN
ABO/RH(D): AB NEG
Antibody Screen: POSITIVE
EXTEND SAMPLE REASON: UNDETERMINED
Unit division: 0
Unit division: 0

## 2016-10-13 LAB — BPAM RBC
BLOOD PRODUCT EXPIRATION DATE: 201808132359
Blood Product Expiration Date: 201808132359
UNIT TYPE AND RH: 600
UNIT TYPE AND RH: 600

## 2016-10-13 SURGERY — Surgical Case
Anesthesia: Spinal | Site: Abdomen | Wound class: Clean

## 2016-10-13 MED ORDER — ONDANSETRON HCL 4 MG/2ML IJ SOLN
INTRAMUSCULAR | Status: DC | PRN
  Administered 2016-10-13: 4 mg via INTRAVENOUS

## 2016-10-13 MED ORDER — SIMETHICONE 80 MG PO CHEW
80.0000 mg | CHEWABLE_TABLET | Freq: Three times a day (TID) | ORAL | Status: DC
  Administered 2016-10-13 – 2016-10-15 (×5): 80 mg via ORAL
  Filled 2016-10-13 (×6): qty 1

## 2016-10-13 MED ORDER — NALBUPHINE HCL 10 MG/ML IJ SOLN: 5.0000 mg | Freq: Once | INTRAMUSCULAR | Status: DC | PRN

## 2016-10-13 MED ORDER — ZOLPIDEM TARTRATE 5 MG PO TABS: 5.0000 mg | ORAL_TABLET | Freq: Every evening | ORAL | Status: DC | PRN

## 2016-10-13 MED ORDER — COCONUT OIL OIL: 1.0000 "application " | TOPICAL_OIL | Status: DC | PRN

## 2016-10-13 MED ORDER — PROPOFOL 10 MG/ML IV BOLUS
INTRAVENOUS | Status: AC
  Filled 2016-10-13: qty 20

## 2016-10-13 MED ORDER — ACETAMINOPHEN 325 MG PO TABS: 650.0000 mg | ORAL_TABLET | ORAL | Status: DC | PRN

## 2016-10-13 MED ORDER — WITCH HAZEL-GLYCERIN EX PADS
1.0000 "application " | MEDICATED_PAD | CUTANEOUS | Status: DC | PRN
  Administered 2016-10-13: 1 via TOPICAL
  Filled 2016-10-13: qty 100

## 2016-10-13 MED ORDER — MEPERIDINE HCL 50 MG/ML IJ SOLN: 6.2500 mg | INTRAMUSCULAR | Status: DC | PRN

## 2016-10-13 MED ORDER — EPHEDRINE SULFATE-NACL 50-0.9 MG/10ML-% IV SOSY
PREFILLED_SYRINGE | INTRAVENOUS | Status: DC | PRN
  Administered 2016-10-13: 10 mg via INTRAVENOUS

## 2016-10-13 MED ORDER — PRENATAL MULTIVITAMIN CH
1.0000 | ORAL_TABLET | Freq: Every day | ORAL | Status: DC
  Administered 2016-10-14 – 2016-10-15 (×2): 1 via ORAL

## 2016-10-13 MED ORDER — ACETAMINOPHEN 500 MG PO TABS
1000.0000 mg | ORAL_TABLET | Freq: Four times a day (QID) | ORAL | Status: AC
  Administered 2016-10-13 – 2016-10-14 (×3): 1000 mg via ORAL
  Filled 2016-10-13 (×4): qty 2

## 2016-10-13 MED ORDER — SENNOSIDES-DOCUSATE SODIUM 8.6-50 MG PO TABS
2.0000 | ORAL_TABLET | ORAL | Status: DC
  Administered 2016-10-14: 2 via ORAL
  Filled 2016-10-13: qty 2

## 2016-10-13 MED ORDER — LACTATED RINGERS IV SOLN: INTRAVENOUS | Status: DC

## 2016-10-13 MED ORDER — DEXAMETHASONE SODIUM PHOSPHATE 10 MG/ML IJ SOLN
INTRAMUSCULAR | Status: DC | PRN
  Administered 2016-10-13: 10 mg via INTRAVENOUS

## 2016-10-13 MED ORDER — FENTANYL CITRATE (PF) 100 MCG/2ML IJ SOLN
INTRAMUSCULAR | Status: DC | PRN
  Administered 2016-10-13: 20 ug via INTRATHECAL

## 2016-10-13 MED ORDER — SOD CITRATE-CITRIC ACID 500-334 MG/5ML PO SOLN
ORAL | Status: AC
  Administered 2016-10-13: 30 mL
  Filled 2016-10-13: qty 15

## 2016-10-13 MED ORDER — KETOROLAC TROMETHAMINE 30 MG/ML IJ SOLN: 30.0000 mg | Freq: Four times a day (QID) | INTRAMUSCULAR | Status: AC

## 2016-10-13 MED ORDER — MENTHOL 3 MG MT LOZG: 1.0000 | LOZENGE | OROMUCOSAL | Status: DC | PRN

## 2016-10-13 MED ORDER — MORPHINE SULFATE (PF) 0.5 MG/ML IJ SOLN
INTRAMUSCULAR | Status: AC
  Filled 2016-10-13: qty 10

## 2016-10-13 MED ORDER — DIBUCAINE 1 % RE OINT
1.0000 "application " | TOPICAL_OINTMENT | RECTAL | Status: DC | PRN
  Administered 2016-10-13: 1 via RECTAL
  Filled 2016-10-13: qty 28

## 2016-10-13 MED ORDER — RHO D IMMUNE GLOBULIN 1500 UNIT/2ML IJ SOSY
300.0000 ug | PREFILLED_SYRINGE | Freq: Once | INTRAMUSCULAR | Status: AC
  Administered 2016-10-14: 300 ug via INTRAVENOUS
  Filled 2016-10-13: qty 2

## 2016-10-13 MED ORDER — NALBUPHINE HCL 10 MG/ML IJ SOLN: 5.0000 mg | INTRAMUSCULAR | Status: DC | PRN

## 2016-10-13 MED ORDER — NALOXONE HCL 0.4 MG/ML IJ SOLN: 0.4000 mg | INTRAMUSCULAR | Status: DC | PRN

## 2016-10-13 MED ORDER — SODIUM CHLORIDE 0.9 % IJ SOLN
INTRAMUSCULAR | Status: DC | PRN
  Administered 2016-10-13: 50 mL

## 2016-10-13 MED ORDER — SODIUM CHLORIDE 0.9 % IV SOLN
INTRAVENOUS | Status: DC | PRN
  Administered 2016-10-13: 50 ug/min via INTRAVENOUS

## 2016-10-13 MED ORDER — MORPHINE SULFATE (PF) 0.5 MG/ML IJ SOLN
INTRAMUSCULAR | Status: DC | PRN
  Administered 2016-10-13: .1 mg via INTRATHECAL

## 2016-10-13 MED ORDER — OXYCODONE-ACETAMINOPHEN 5-325 MG PO TABS: 1.0000 | ORAL_TABLET | ORAL | Status: DC | PRN

## 2016-10-13 MED ORDER — LACTATED RINGERS IV SOLN
INTRAVENOUS | Status: DC
  Administered 2016-10-13: 07:00:00 via INTRAVENOUS

## 2016-10-13 MED ORDER — OXYCODONE-ACETAMINOPHEN 5-325 MG PO TABS
2.0000 | ORAL_TABLET | ORAL | Status: DC | PRN
  Administered 2016-10-14 – 2016-10-15 (×7): 2 via ORAL
  Filled 2016-10-13 (×7): qty 2

## 2016-10-13 MED ORDER — CEFAZOLIN SODIUM-DEXTROSE 2-4 GM/100ML-% IV SOLN
2.0000 g | INTRAVENOUS | Status: AC
  Administered 2016-10-13: 2 g via INTRAVENOUS
  Filled 2016-10-13: qty 100

## 2016-10-13 MED ORDER — DIPHENHYDRAMINE HCL 25 MG PO CAPS
25.0000 mg | ORAL_CAPSULE | Freq: Four times a day (QID) | ORAL | Status: DC | PRN
Start: 2016-10-13 — End: 2016-10-15

## 2016-10-13 MED ORDER — DIPHENHYDRAMINE HCL 25 MG PO CAPS
25.0000 mg | ORAL_CAPSULE | ORAL | Status: DC | PRN
  Administered 2016-10-13: 25 mg via ORAL
  Filled 2016-10-13 (×2): qty 1

## 2016-10-13 MED ORDER — OXYTOCIN 40 UNITS IN LACTATED RINGERS INFUSION - SIMPLE MED
INTRAVENOUS | Status: DC | PRN
  Administered 2016-10-13: 800 mL via INTRAVENOUS

## 2016-10-13 MED ORDER — ONDANSETRON HCL 4 MG/2ML IJ SOLN: 4.0000 mg | Freq: Three times a day (TID) | INTRAMUSCULAR | Status: DC | PRN

## 2016-10-13 MED ORDER — IBUPROFEN 600 MG PO TABS
600.0000 mg | ORAL_TABLET | Freq: Four times a day (QID) | ORAL | Status: DC
  Administered 2016-10-14 – 2016-10-15 (×4): 600 mg via ORAL
  Filled 2016-10-13 (×4): qty 1

## 2016-10-13 MED ORDER — SIMETHICONE 80 MG PO CHEW: 80.0000 mg | CHEWABLE_TABLET | ORAL | Status: DC | PRN

## 2016-10-13 MED ORDER — OXYTOCIN 40 UNITS IN LACTATED RINGERS INFUSION - SIMPLE MED
INTRAVENOUS | Status: AC
  Administered 2016-10-13: 09:00:00
  Filled 2016-10-13: qty 1000

## 2016-10-13 MED ORDER — KETOROLAC TROMETHAMINE 30 MG/ML IJ SOLN
30.0000 mg | Freq: Four times a day (QID) | INTRAMUSCULAR | Status: AC
  Administered 2016-10-13 – 2016-10-14 (×4): 30 mg via INTRAVENOUS
  Filled 2016-10-13 (×4): qty 1

## 2016-10-13 MED ORDER — BUPIVACAINE IN DEXTROSE 0.75-8.25 % IT SOLN
INTRATHECAL | Status: DC | PRN
  Administered 2016-10-13: 1.8 mL via INTRATHECAL

## 2016-10-13 MED ORDER — OXYTOCIN 40 UNITS IN LACTATED RINGERS INFUSION - SIMPLE MED
2.5000 [IU]/h | INTRAVENOUS | Status: AC
  Filled 2016-10-13: qty 1000

## 2016-10-13 MED ORDER — DIPHENHYDRAMINE HCL 50 MG/ML IJ SOLN
12.5000 mg | INTRAMUSCULAR | Status: DC | PRN
  Administered 2016-10-13: 12.5 mg via INTRAVENOUS
  Filled 2016-10-13: qty 1

## 2016-10-13 MED ORDER — NALBUPHINE HCL 10 MG/ML IJ SOLN
5.0000 mg | INTRAMUSCULAR | Status: DC | PRN
  Administered 2016-10-13: 5 mg via INTRAVENOUS
  Filled 2016-10-13: qty 1

## 2016-10-13 MED ORDER — BUPIVACAINE LIPOSOME 1.3 % IJ SUSP: 20.0000 mL | Freq: Once | INTRAMUSCULAR | Status: DC

## 2016-10-13 MED ORDER — BUPIVACAINE IN DEXTROSE 0.75-8.25 % IT SOLN
INTRATHECAL | Status: AC
  Filled 2016-10-13: qty 2

## 2016-10-13 MED ORDER — SODIUM CHLORIDE 0.9% FLUSH: 3.0000 mL | INTRAVENOUS | Status: DC | PRN

## 2016-10-13 MED ORDER — BUPIVACAINE LIPOSOME 1.3 % IJ SUSP
INTRAMUSCULAR | Status: DC | PRN
  Administered 2016-10-13: 50 mL

## 2016-10-13 MED ORDER — SIMETHICONE 80 MG PO CHEW
80.0000 mg | CHEWABLE_TABLET | ORAL | Status: DC
  Administered 2016-10-14 (×2): 80 mg via ORAL
  Filled 2016-10-13 (×2): qty 1

## 2016-10-13 MED ORDER — OXYCODONE HCL 5 MG PO TABS
10.0000 mg | ORAL_TABLET | ORAL | Status: DC | PRN
  Administered 2016-10-13 – 2016-10-14 (×4): 10 mg via ORAL
  Filled 2016-10-13 (×4): qty 2

## 2016-10-13 MED ORDER — TETANUS-DIPHTH-ACELL PERTUSSIS 5-2.5-18.5 LF-MCG/0.5 IM SUSP: 0.5000 mL | Freq: Once | INTRAMUSCULAR | Status: DC

## 2016-10-13 MED ORDER — FENTANYL CITRATE (PF) 100 MCG/2ML IJ SOLN
INTRAMUSCULAR | Status: AC
  Filled 2016-10-13: qty 2

## 2016-10-13 MED ORDER — OXYCODONE HCL 5 MG PO TABS: 5.0000 mg | ORAL_TABLET | ORAL | Status: DC | PRN

## 2016-10-13 SURGICAL SUPPLY — 24 items
BARRIER ADHS 3X4 INTERCEED (GAUZE/BANDAGES/DRESSINGS) ×2 IMPLANT
CANISTER SUCT 3000ML PPV (MISCELLANEOUS) ×2 IMPLANT
CHLORAPREP W/TINT 26ML (MISCELLANEOUS) ×2 IMPLANT
DRSG TELFA 3X8 NADH (GAUZE/BANDAGES/DRESSINGS) ×2 IMPLANT
ELECT CAUTERY BLADE 6.4 (BLADE) ×2 IMPLANT
ELECT REM PT RETURN 9FT ADLT (ELECTROSURGICAL) ×2
ELECTRODE REM PT RTRN 9FT ADLT (ELECTROSURGICAL) ×1 IMPLANT
GAUZE SPONGE 4X4 12PLY STRL (GAUZE/BANDAGES/DRESSINGS) ×2 IMPLANT
GLOVE BIO SURGEON STRL SZ8 (GLOVE) ×2 IMPLANT
GOWN STRL REUS W/ TWL LRG LVL3 (GOWN DISPOSABLE) ×2 IMPLANT
GOWN STRL REUS W/ TWL XL LVL3 (GOWN DISPOSABLE) ×1 IMPLANT
GOWN STRL REUS W/TWL LRG LVL3 (GOWN DISPOSABLE) ×2
GOWN STRL REUS W/TWL XL LVL3 (GOWN DISPOSABLE) ×1
NDL SAFETY 22GX1.5 (NEEDLE) ×2 IMPLANT
NS IRRIG 1000ML POUR BTL (IV SOLUTION) ×2 IMPLANT
PACK C SECTION AR (MISCELLANEOUS) ×2 IMPLANT
PAD OB MATERNITY 4.3X12.25 (PERSONAL CARE ITEMS) ×2 IMPLANT
PAD PREP 24X41 OB/GYN DISP (PERSONAL CARE ITEMS) ×2 IMPLANT
STAPLER INSORB 30 2030 C-SECTI (MISCELLANEOUS) ×2 IMPLANT
STRAP SAFETY BODY (MISCELLANEOUS) ×2 IMPLANT
SUT CHROMIC 1 CTX 36 (SUTURE) ×6 IMPLANT
SUT PLAIN GUT 0 (SUTURE) ×4 IMPLANT
SUT VIC AB 0 CT1 36 (SUTURE) ×4 IMPLANT
SYR 30ML LL (SYRINGE) ×4 IMPLANT

## 2016-10-13 NOTE — Anesthesia Preprocedure Evaluation (Signed)
Anesthesia Evaluation  Patient identified by MRN, date of birth, ID band Patient awake    Reviewed: Allergy & Precautions, NPO status , Patient's Chart, lab work & pertinent test results  History of Anesthesia Complications Negative for: history of anesthetic complications  Airway Mallampati: II  TM Distance: >3 FB Neck ROM: Full    Dental no notable dental hx.    Pulmonary neg sleep apnea, neg COPD, Current Smoker,    breath sounds clear to auscultation- rhonchi (-) wheezing      Cardiovascular Exercise Tolerance: Good (-) hypertension(-) CAD, (-) Past MI and (-) Cardiac Stents  Rhythm:Regular Rate:Normal - Systolic murmurs and - Diastolic murmurs    Neuro/Psych PSYCHIATRIC DISORDERS Anxiety Depression negative neurological ROS     GI/Hepatic negative GI ROS, Neg liver ROS,   Endo/Other  negative endocrine ROSneg diabetes  Renal/GU negative Renal ROS     Musculoskeletal negative musculoskeletal ROS (+)   Abdominal (+) - obese, Gravid abdomen  Peds  Hematology  (+) anemia ,   Anesthesia Other Findings Hx of 2 prior csections done under neuraxial anesthesia  Reproductive/Obstetrics (+) Pregnancy                             Anesthesia Physical Anesthesia Plan  ASA: II  Anesthesia Plan: Spinal   Post-op Pain Management:    Induction:   PONV Risk Score and Plan: 1 and Ondansetron  Airway Management Planned: Natural Airway  Additional Equipment:   Intra-op Plan:   Post-operative Plan:   Informed Consent: I have reviewed the patients History and Physical, chart, labs and discussed the procedure including the risks, benefits and alternatives for the proposed anesthesia with the patient or authorized representative who has indicated his/her understanding and acceptance.   Dental advisory given  Plan Discussed with: Anesthesiologist and CRNA  Anesthesia Plan Comments:          Lab Results  Component Value Date   WBC 11.9 (H) 10/12/2016   HGB 11.7 (L) 10/12/2016   HCT 35.1 10/12/2016   MCV 84.1 10/12/2016   PLT 236 10/12/2016    Anesthesia Quick Evaluation

## 2016-10-13 NOTE — Anesthesia Post-op Follow-up Note (Cosign Needed)
Anesthesia QCDR form completed.        

## 2016-10-13 NOTE — Anesthesia Procedure Notes (Signed)
Procedure Name: MAC Date/Time: 10/13/2016 7:45 AM Performed by: Demetrius Charity Pre-anesthesia Checklist: Patient identified, Emergency Drugs available, Suction available, Patient being monitored and Timeout performed Oxygen Delivery Method: Nasal cannula

## 2016-10-13 NOTE — Discharge Summary (Addendum)
Obstetric Discharge Summary   Patient ID: Patient Name: Yvonne Shea DOB: Dec 29, 1990 MRN: [id number]  Date of Admission: 10/13/2016 Date of Discharge: 10/15/16 [facility]   Gestational Age at Delivery: 2853w2d   Antepartum complications: +MJ use Admitting Diagnosis: 39+2 week elective repeat c/s   Secondary Diagnoses: Patient Active Problem List   Diagnosis Date Noted  . Post-operative state 10/13/2016  . Indication for care in labor and delivery, antepartum 10/08/2016  . Pregnancy 09/11/2016  . Preterm uterine contractions 08/09/2016  . Flank pain, acute 07/12/2016  . UTI (urinary tract infection) during pregnancy, second trimester 06/11/2016  . Panic disorder 06/11/2016  . 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 Uterine contractions during pregnancy 05/28/2016  . First trimester screening     Augmentation: None Complications:none Intrapartum complications/course: routine  Date of Delivery: 10/13/2016 @ 0804 Delivered By: schermerhorn MD Delivery Type: repeat cesarean section, low transverse incision Anesthesia:spinal Placenta: manual Laceration:  Episiotomy: none  Newborn Data: Live born female  Birth Weight: 8 lb 0.8 oz (3650 g) APGAR: 8, 9      Postpartum Course  Patient had an uncomplicated postpartum course.  By time of discharge on POD 2, her pain was controlled on oral pain medications; she had appropriate lochia and was ambulating, voiding without difficulty and tolerating regular diet.  She discussed her rising anxiety. She was deemed stable for discharge to home.     Labs: CBC Latest Ref Rng & Units 10/14/2016 10/12/2016 09/29/2016  WBC 3.6 - 11.0 K/uL 18.1(H) 11.9(H) 11.9(H)  Hemoglobin 12.0 - 16.0 g/dL 4.0(J9.4(L) 11.7(L) 10.5(L)  Hematocrit 35.0 - 47.0 % 27.6(L) 35.1 30.8(L)  Platelets 150 - 440 K/uL 215 236 234   AB NEG  Physical exam:  BP 111/66 (BP Location: Left Arm)   Pulse 77   Temp 98.4 F (36.9 C) (Oral)    Resp 18   Ht 5\' 4"  (1.626 m)   Wt 78.5 kg (173 lb)   SpO2 98%   Breastfeeding? Unknown   BMI 29.70 kg/m  General: alert and no distress Pulm: normal respiratory effort Lochia: appropriate Abdomen: soft, NT Uterine Fundus: firm, below umbilicus Incision: c/d/i, healing well, no significant drainage, no dehiscence, no significant erythema Extremities: No evidence of DVT seen on physical exam. No lower extremity edema.   Disposition: stable, discharge to home Baby Feeding: formula Baby Disposition: home with mom  Contraception: TBD  Prenatal Labs:   ABO, Rh: --/--/AB NEG (03/04 2309) Antibody: NEG (03/04 2309) Rubella:  imm , varicella imm  RPR:   neg HBsAg:   neg HIV:   neg GBS:   Positive   Plan:  Yvonne Shea was discharged to home in good condition. [facility] 2 weeks To arrange for mental health visit on Monday with health department or go to [facility].   Discharge Instructions: Per After Visit Summary. Activity: Advance as tolerated. Pelvic rest for 6 weeks.  Refer to After Visit Summary Diet: Regular Discharge Medications: Allergies as of 10/15/2016      Reactions   Sulfa Antibiotics Nausea Only, Nausea And Vomiting      Medication List    TAKE these medications   ciprofloxacin 500 MG tablet Commonly known as:  CIPRO Take 500 mg by mouth 2 (two) times daily.   clonazePAM 0.5 MG disintegrating tablet Commonly known as:  KLONOPIN Take 1 tablet (0.5 mg total) by mouth 2 (two) times daily as needed for seizure (anxiety).   escitalopram 10  MG tablet Commonly known as:  LEXAPRO Take 1 tablet (10 mg total) by mouth daily.   ferrous sulfate 325 (65 FE) MG tablet Take 325 mg by mouth daily with breakfast.   ibuprofen 600 MG tablet Commonly known as:  ADVIL,MOTRIN Take 1 tablet (600 mg total) by mouth every 6 (six) hours.   oxyCODONE-acetaminophen 5-325 MG tablet Commonly known as:  PERCOCET/ROXICET Take 1 tablet  by mouth every 4 (four) hours as needed (pain scale 4-7).      Outpatient follow up:  Follow-up Information    Schermerhorn, Ihor Austin, MD Follow up in 2 week(s).   Specialty:  Obstetrics and Gynecology Contact information: [address] [place] [address] [phone]            Signed ----- [name], MD Attending Obstetrician and Gynecologist [facility], Department of OB/GYN [facility]

## 2016-10-13 NOTE — Op Note (Signed)
NAMEWANDALENE, ABRAMS               ACCOUNT NO.:  [id number]  MEDICAL RECORD NO.:  [id number]  LOCATION:                                 FACILITY:  PHYSICIAN:  Jennell Corner, MDDATE OF BIRTH:  December 19, 1990  DATE OF PROCEDURE:  10/13/2016 DATE OF DISCHARGE:                              OPERATIVE REPORT   PREOPERATIVE DIAGNOSIS: 1. A 39+ 2 weeks estimated gestational age. 2. Elective repeat cesarean section.  POSTOPERATIVE DIAGNOSIS: 1. A 39+ 2 weeks estimated gestational age. 2. Elective repeat cesarean section. 3. Viable female.  PROCEDURE PERFORMED:  Repeat low-transverse cesarean section.  SURGEON:  Jennell Corner, MD  ANESTHESIA:  Spinal.  SURGEON:  Jennell Corner, MD.  FIRST ASSISTANT:  Scrub tech, Brame.  INDICATION:  A 26 year old gravida 5, para 2 patient at 39+ 2 weeks estimated gestational age with 2 prior cesarean sections.  Again, she elects for a repeat cesarean section.  DESCRIPTION OF PROCEDURE:  After adequate spinal anesthesia, the patient was placed in dorsal supine position.  Hip rolled on the right side. The patient's abdomen was prepped and draped in normal sterile fashion. Time-out was performed.  The patient did receive 2 g IV Ancef prior to commencement of the case.  A Pfannenstiel incision was made.  Sharp dissection was used to identify the fascia.  Fascia was opened in midline and opened in a transverse fashion.  The superior aspect of the fascia was grasped with Kocher clamps and the recti muscles were dissected free.  Inferior aspect of the fascia was grasped with Kocher clamps and the pyramidalis muscles were dissected free.  Entry into the peritoneal cavity was accomplished sharply.  The vesicouterine peritoneal fold was identified.  Bladder flap was created and bladder was reflected inferiorly.  A low transverse uterine incision was made upon entry into the amniotic cavity.  Clear fluid resulted and the incision was extended  with blunt transverse traction.  Fetal head was brought to the incision and delivered with fundal pressure.  Shoulders were delivered without difficulty.  Vigorous female was then delivered at 0804 and was dried on the abdomen for 60 seconds waiting for delayed cord clamping.  Apgars 8 and 9, weight 8 pounds 1 ounce.  The placenta was manually delivered and the uterus was exteriorized.  The endometrial cavity was wiped clean with laparotomy tape and the ring forceps was used to open the cervix.  Uterine incision was closed with 1 chromic suture in a running locking fashion with good approximation of edges. Fallopian tubes and ovaries appeared normal.  Posterior cul-de-sac was irrigated and suctioned and the uterus was placed back in the abdominal cavity.  Pericolic gutters were wiped clean with laparotomy tape.  The uterine incision again appeared hemostatic.  Interceed was placed on the uterine incision in a T-shaped fashion.  The fascia was then closed with 0 Vicryl suture in a running nonlocking fashion with good approximation of edges.  The fascia was then injected with a solution of 1.3% Exparel 20 mL plus 0.5% Marcaine 30 mL and 50 mL of normal saline, 60 mL were injected in the fascial edge.  The subcutaneous tissues were irrigated and bovied for hemostasis and the skin  was reapproximated with Insorb absorbable staples.  The incision was then injected with additional 30 mL of the Exparel solution.  There were no complications.  The patient tolerated the procedure well.  ESTIMATED BLOOD LOSS:  300 mL.  INTRAOPERATIVE FLUIDS:  1100 mL.  URINE OUTPUT:  150 mL.  DISPOSITION:  The patient was taken to recovery room in good condition.    ______________________________ Jennell Cornerhomas Saadia Dewitt, MD   ______________________________ Jennell Cornerhomas Rayyan Burley, MD    TS/MEDQ  D:  10/13/2016  T:  10/13/2016  Job:  [id number]

## 2016-10-13 NOTE — Brief Op Note (Signed)
10/13/2016  8:34 AM  PATIENT:  Yvonne Shea  26 y.o. female  PRE-OPERATIVE DIAGNOSIS:  Prior cesarean  POST-OPERATIVE DIAGNOSIS:  Prior cesarean  PROCEDURE:  Procedure(s) with comments: CESAREAN SECTION (N/A) - Female born @ 390804 Weight: 8lbs 1oz Apagrs: 8/9 LTCS SURGEON:  Surgeon(s) and Role:    * Schermerhorn, Ihor Austinhomas J, MD - Primary  PHYSICIAN ASSISTANT:   ASSISTANTS: Brame, l scrub tech    ANESTHESIA:   spinal  EBL:  Total I/O In: 300 [I.V.:300] Out: 450 [Urine:150; Blood:300]  BLOOD ADMINISTERED:none  DRAINS: Urinary Catheter (Foley)   LOCAL MEDICATIONS USED:  MARCAINE    and BUPIVICAINE   SPECIMEN:  No Specimen  DISPOSITION OF SPECIMEN:  N/A  COUNTS:  YES  TOURNIQUET:  * No tourniquets in log *  DICTATION: .Other Dictation: Dictation Number verbal  PLAN OF CARE: Admit to inpatient   PATIENT DISPOSITION:  PACU - hemodynamically stable.   Delay start of Pharmacological VTE agent (>24hrs) due to surgical blood loss or risk of bleeding: not applicable

## 2016-10-13 NOTE — Progress Notes (Signed)
Paged Dr.[place]en Anesthesiologist regarding pt's order for Roxicodone after Spinal with narcotics.  Per MD ok to give if necessary.

## 2016-10-13 NOTE — Op Note (Deleted)
  The note originally documented on this encounter has been moved the the encounter in which it belongs.  

## 2016-10-13 NOTE — Anesthesia Procedure Notes (Addendum)
Spinal  Patient location during procedure: OB Start time: 10/13/2016 7:45 AM End time: 10/13/2016 7:52 AM Staffing Anesthesiologist: Randa Lynn AMY Resident/CRNA: Demetrius Charity Performed: anesthesiologist  Preanesthetic Checklist Completed: patient identified, site marked, surgical consent, pre-op evaluation, timeout performed, IV checked, risks and benefits discussed and monitors and equipment checked Spinal Block Patient position: sitting Prep: ChloraPrep Patient monitoring: heart rate, continuous pulse ox, blood pressure and cardiac monitor Approach: midline Location: L4-5 Injection technique: single-shot Needle Needle type: Introducer and Pencil-Tip  Needle gauge: 24 G Needle length: 9 cm Additional Notes Negative paresthesia. Negative blood return. Positive free-flowing CSF. Expiration date of kit checked and confirmed. Patient tolerated procedure well, without complications.

## 2016-10-13 NOTE — Transfer of Care (Signed)
Immediate Anesthesia Transfer of Care Note  Patient: Yvonne Shea  Procedure(s) Performed: Procedure(s) with comments: CESAREAN SECTION (N/A) - Female born @ 710804 Weight: 8lbs 1oz Apagrs: 8/9  Patient Location: PACU  Anesthesia Type:Spinal  Level of Consciousness: awake, alert  and oriented  Airway & Oxygen Therapy: Patient Spontanous Breathing and Patient connected to nasal cannula oxygen  Post-op Assessment: Report given to RN and Post -op Vital signs reviewed and stable  Post vital signs: Reviewed and stable  Last Vitals:  Vitals:   10/13/16 0609 10/13/16 0730  BP: 119/73   Pulse: 78   Resp: 16   Temp: 36.4 C 36.8 C    Last Pain:  Vitals:   10/13/16 0730  TempSrc: Oral  PainSc:          Complications: No apparent anesthesia complications

## 2016-10-14 LAB — CBC
HEMATOCRIT: 27.6 % — AB (ref 35.0–47.0)
Hemoglobin: 9.4 g/dL — ABNORMAL LOW (ref 12.0–16.0)
MCH: 28.8 pg (ref 26.0–34.0)
MCHC: 34.2 g/dL (ref 32.0–36.0)
MCV: 84.4 fL (ref 80.0–100.0)
Platelets: 215 10*3/uL (ref 150–440)
RBC: 3.27 MIL/uL — ABNORMAL LOW (ref 3.80–5.20)
RDW: 15.3 % — AB (ref 11.5–14.5)
WBC: 18.1 10*3/uL — AB (ref 3.6–11.0)

## 2016-10-14 LAB — FETAL SCREEN: FETAL SCREEN: NEGATIVE

## 2016-10-14 MED ORDER — CLONAZEPAM 0.5 MG PO TBDP
0.5000 mg | ORAL_TABLET | Freq: Two times a day (BID) | ORAL | Status: DC | PRN
  Administered 2016-10-14 – 2016-10-15 (×2): 0.5 mg via ORAL
  Filled 2016-10-14 (×2): qty 1

## 2016-10-14 NOTE — Clinical Social Work Note (Signed)
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 The MOB needs a more recent UDS. The child's UDS is clear of all substances. Please consult should this change.  Yvonne PonderKaren Martha Antaeus Shea, MSW, Theresia MajorsLCSWA [phone]

## 2016-10-14 NOTE — Progress Notes (Signed)
Subjective: Postpartum Day 1: Cesarean Delivery Patient reports no problems voiding.   Pain ok  Objective: Vital signs in last 24 hours: Temp:  [97.7 F (36.5 C)-98.6 F (37 C)] 98.2 F (36.8 C) (07/21 1200) Pulse Rate:  [66-78] 66 (07/21 1200) Resp:  [16-20] 17 (07/21 1200) BP: (99-119)/(51-76) 105/62 (07/21 1200) SpO2:  [98 %-100 %] 100 % (07/21 0755)  Physical Exam:  General: alert and cooperative Lochia: appropriate Uterine Fundus: firm Incision: no significant drainage DVT Evaluation: No evidence of DVT seen on physical exam. CV RRR  Lungs CTA  Recent Labs  10/12/16 0959 10/14/16 0646  HGB 11.7* 9.4*  HCT 35.1 27.6*    Assessment/Plan: Status post Cesarean section. Doing well postoperatively.  Continue current care. Probable d/c in am Ihor Austinhomas J Lasonia Casino 10/14/2016, 12:03 PM

## 2016-10-15 ENCOUNTER — Encounter: Payer: Self-pay | Admitting: Obstetrics and Gynecology

## 2016-10-15 LAB — RHOGAM INJECTION: UNIT DIVISION: 0

## 2016-10-15 MED ORDER — CLONAZEPAM 0.5 MG PO TBDP: 0.5000 mg | ORAL_TABLET | Freq: Two times a day (BID) | ORAL | 1 refills | Status: AC | PRN

## 2016-10-15 MED ORDER — ESCITALOPRAM OXALATE 10 MG PO TABS: 10.0000 mg | ORAL_TABLET | Freq: Every day | ORAL | 2 refills | Status: AC

## 2016-10-15 MED ORDER — IBUPROFEN 600 MG PO TABS: 600.0000 mg | ORAL_TABLET | Freq: Four times a day (QID) | ORAL | 1 refills | Status: AC

## 2016-10-15 MED ORDER — OXYCODONE-ACETAMINOPHEN 5-325 MG PO TABS: 1.0000 | ORAL_TABLET | ORAL | 0 refills | Status: AC | PRN

## 2016-10-15 NOTE — Progress Notes (Signed)
D/C instructions provided, pt states understanding, aware of follow up appt. Prescriptions given to pt.   

## 2016-10-15 NOTE — Progress Notes (Signed)
D/C home to car via auxiliary in wheelchair.  

## 2016-10-15 NOTE — Discharge Instructions (Signed)
Discharge instructions:   Call office if you have any of the following: headache, visual changes, fever >101.0 F, chills, breast concerns, excessive vaginal bleeding, incision drainage or problems, leg pain or redness, depression or any other concerns.   Activity: Do not lift > 10 lbs for 6 weeks.  No intercourse or tampons for 6 weeks.  No driving for 1-2 weeks.   Call your doctor for increased pain or vaginal bleeding, temperature above 101.0, depression, or concerns.  No strenuous activity or heavy lifting for 6 weeks.  No intercourse, tampons, douching, or enemas for 6 weeks.  No tub baths-showers only.  No driving for 2 weeks or while taking pain medications.  Continue prenatal vitamin and iron.  Increase calories and fluids while breastfeeding.  You may have a slight fever when your milk comes in, but it should go away on its own.  If it does not, and rises above 101.0 please call the doctor.  For concerns about your baby, please call your pediatrician  Please make arrangements to see Marchelle Folksmanda the therapist at the health department when you go on Monday.  Otherwise go to [facility] to establish care for continued mental health support.

## 2016-10-15 NOTE — Anesthesia Postprocedure Evaluation (Signed)
Anesthesia Post Note  Patient: Lynwood DawleyMariah XXXTaylor  Procedure(s) Performed: Procedure(s) (LRB): CESAREAN SECTION (N/A)  Patient location during evaluation: Mother Baby Anesthesia Type: Spinal Level of consciousness: oriented and awake and alert Pain management: pain level controlled Vital Signs Assessment: post-procedure vital signs reviewed and stable Respiratory status: spontaneous breathing, respiratory function stable and patient connected to nasal cannula oxygen Cardiovascular status: blood pressure returned to baseline and stable Postop Assessment: no headache and no backache Anesthetic complications: no     Last Vitals:  Vitals:   10/14/16 2325 10/15/16 0336  BP:    Pulse:    Resp:    Temp: 36.6 C 36.4 C    Last Pain:  Vitals:   10/15/16 0336  TempSrc: Oral  PainSc: 8                  Rica MastBachich,  Andre Gallego M

## 2016-10-15 NOTE — Anesthesia Post-op Follow-up Note (Signed)
  Anesthesia Pain Follow-up Note  Patient: Yvonne Shea  Day #: 2  Date of Follow-up: 10/15/2016 Time: 7:26 AM  Last Vitals:  Vitals:   10/14/16 2325 10/15/16 0336  BP:    Pulse:    Resp:    Temp: 36.6 C 36.4 C    Level of Consciousness: alert  Pain: mild   Side Effects:None  Catheter Site Exam:clean, dry     Plan: D/C from anesthesia care at surgeon's request  Rica MastBachich,  Yvonne Shea

## 2016-10-17 ENCOUNTER — Emergency Department
Admission: EM | Admit: 2016-10-17 | Discharge: 2016-10-17 | Disposition: A | Discharge status: Home / Self Care | Payer: Medicaid Other | Attending: Emergency Medicine | Admitting: Emergency Medicine

## 2016-10-17 ENCOUNTER — Emergency Department: Payer: Medicaid Other

## 2016-10-17 ENCOUNTER — Other Ambulatory Visit: Payer: Self-pay

## 2016-10-17 ENCOUNTER — Encounter: Payer: Self-pay | Admitting: *Deleted

## 2016-10-17 DIAGNOSIS — R0602 Shortness of breath: Secondary | ICD-10-CM | POA: Diagnosis not present

## 2016-10-17 DIAGNOSIS — F17210 Nicotine dependence, cigarettes, uncomplicated: Secondary | ICD-10-CM | POA: Insufficient documentation

## 2016-10-17 DIAGNOSIS — R079 Chest pain, unspecified: Secondary | ICD-10-CM | POA: Diagnosis present

## 2016-10-17 DIAGNOSIS — Z79899 Other long term (current) drug therapy: Secondary | ICD-10-CM | POA: Diagnosis not present

## 2016-10-17 LAB — CBC
HCT: 32.9 % — ABNORMAL LOW (ref 35.0–47.0)
HEMOGLOBIN: 11.2 g/dL — AB (ref 12.0–16.0)
MCH: 28.6 pg (ref 26.0–34.0)
MCHC: 34.1 g/dL (ref 32.0–36.0)
MCV: 84.1 fL (ref 80.0–100.0)
PLATELETS: 269 10*3/uL (ref 150–440)
RBC: 3.91 MIL/uL (ref 3.80–5.20)
RDW: 15.9 % — ABNORMAL HIGH (ref 11.5–14.5)
WBC: 10.9 10*3/uL (ref 3.6–11.0)

## 2016-10-17 LAB — BASIC METABOLIC PANEL
ANION GAP: 7 (ref 5–15)
BUN: 11 mg/dL (ref 6–20)
CO2: 24 mmol/L (ref 22–32)
CREATININE: 0.7 mg/dL (ref 0.44–1.00)
Calcium: 8.8 mg/dL — ABNORMAL LOW (ref 8.9–10.3)
Chloride: 106 mmol/L (ref 101–111)
GFR calc non Af Amer: >60 mL/min (ref >60)
Glucose, Bld: 73 mg/dL (ref 65–99)
POTASSIUM: 3.7 mmol/L (ref 3.5–5.1)
SODIUM: 137 mmol/L (ref 135–145)

## 2016-10-17 LAB — TROPONIN I: Troponin I: <0.03 ng/mL (ref <0.03)

## 2016-10-17 NOTE — Discharge Instructions (Signed)
You have been seen in the emergency department today for chest pain. Your workup has shown normal results. As we discussed please follow-up with your primary care physician in the next 1-2 days for recheck. Return to the emergency department for any further chest pain, trouble breathing, or any other symptom personally concerning to yourself.  Please call the number provided for cardiology tomorrow morning to arrange a follow-up appointment for further workup and consideration of stress test and/or echocardiogram.

## 2016-10-17 NOTE — ED Triage Notes (Signed)
Pt arrived to ED reporting centralized chest pain beginning today. Pt reports SOB and back pain as well. Back pain is at the site of a recent epidural. Pt had a C -section performed on Friday. No cough or fevers reported. No bleeding from incision site . Pt reports having  Dizziness and SOB as well. PT reports having similar feelings when having anxiety attacks in the past but is unsure if this is related to anxiety at this time. PT is tearful in triage.

## 2016-10-17 NOTE — ED Notes (Signed)
Dr. Lenard LancePaduchowski at bedside.   Pt tearful. Husband and newborn at bedside.   Pt had c section recently, states pain from epidural. States SOB and CP. Denies coughing anything up. Pt states she was dx with anxiety when finding out she was pregnant, admitted to psych for anxiety. States noise is a trigger.

## 2016-10-17 NOTE — ED Provider Notes (Signed)
[facility] Emergency Department Provider Note  Time seen: 8:11 PM  I have reviewed the triage vital signs and the nursing notes.   HISTORY  Chief Complaint Chest Pain and Shortness of Breath    HPI Yvonne Shea is a 26 y.o. female with a past medical history of anxiety presents to the emergency department for chest pain. According to the patient she had a C-section 5 days ago. She states earlier today she developed central chest pain somewhat worse with movement. States some mild shortness of breath where she felt like she cannot get a full deep breath but states that is largely resolved. Denies any leg pain or swelling. Denies any nausea or diaphoresis. Patient denies any abdominal pain continues to have mild vaginal bleeding/spotting. Patient states a significant history of anxiety for she is prescribed lorazepam and Klonopin. States she took them this afternoon but she did not feel that her helped her symptoms as she came to the ER. She states she has had chest pain symptoms in the past with her anxiety and she does not know if this is anxiety or something is wrong.  Past Medical History:  Diagnosis Date  . Anxiety   . Depression   . Mental disorder     Patient Active Problem List   Diagnosis Date Noted  . Post-operative state 10/13/2016  . Indication for care in labor and delivery, antepartum 10/08/2016  . Pregnancy 09/11/2016  . Preterm uterine contractions 08/09/2016  . Flank pain, acute 07/12/2016  . UTI (urinary tract infection) during pregnancy, second trimester 06/11/2016  . Panic disorder 06/11/2016  . 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 Uterine contractions during pregnancy 05/28/2016  . First trimester screening     Past Surgical History:  Procedure Laterality Date  . APPENDECTOMY    . CESAREAN SECTION    . CESAREAN SECTION N/A 10/13/2016   Procedure: CESAREAN SECTION;  Surgeon: Feliberto GottronSchermerhorn, Ihor Austinhomas J, MD;  Location: [facility]  ORS;  Service: Obstetrics;  Laterality: N/A;  Female born @ 650804 Weight: 8lbs 1oz Apagrs: 8/9  . CHOLECYSTECTOMY    . DILATION AND CURETTAGE OF UTERUS      Prior to Admission medications   Medication Sig Start Date End Date Taking? Authorizing Provider  ciprofloxacin (CIPRO) 500 MG tablet Take 500 mg by mouth 2 (two) times daily.    [provider]  clonazePAM (KLONOPIN) 0.5 MG disintegrating tablet Take 1 tablet (0.5 mg total) by mouth 2 (two) times daily as needed for seizure (anxiety). 10/15/16   Ward, Elenora Fenderhelsea C, MD  escitalopram (LEXAPRO) 10 MG tablet Take 1 tablet (10 mg total) by mouth daily. 10/15/16 10/15/17  Ward, Elenora Fenderhelsea C, MD  ferrous sulfate 325 (65 FE) MG tablet Take 325 mg by mouth daily with breakfast.    [provider]  ibuprofen (ADVIL,MOTRIN) 600 MG tablet Take 1 tablet (600 mg total) by mouth every 6 (six) hours. 10/15/16   Ward, Elenora Fenderhelsea C, MD  oxyCODONE-acetaminophen (PERCOCET/ROXICET) 5-325 MG tablet Take 1 tablet by mouth every 4 (four) hours as needed (pain scale 4-7). 10/15/16   Ward, Elenora Fenderhelsea C, MD    Allergies  Allergen Reactions  . Sulfa Antibiotics Nausea Only and Nausea And Vomiting    History reviewed. No pertinent family history.  Social History Social History  Substance Use Topics  . Smoking status: Current Every Day Smoker    Packs/day: 1.00    Types: Cigarettes  . Smokeless tobacco: Never Used  . Alcohol use No  Review of Systems Constitutional: Negative for fever. Cardiovascular: Positive for mild to moderate central chest pain. Respiratory: Negative for shortness of breath. Gastrointestinal: Negative for abdominal pain, vomiting Musculoskeletal: States mild discomfort at the site of the epidural. Neurological: Negative for headache All other ROS negative  ____________________________________________   PHYSICAL EXAM:  VITAL SIGNS: ED Triage Vitals  Enc Vitals Group     BP 10/17/16 1847 (!) 165/95     Pulse Rate  10/17/16 1847 78     Resp 10/17/16 1847 16     Temp 10/17/16 1847 99.1 F (37.3 C)     Temp Source 10/17/16 1847 Oral     SpO2 10/17/16 1847 97 %     Weight 10/17/16 1847 166 lb (75.3 kg)     Height 10/17/16 1847 5\' 4"  (1.626 m)     Head Circumference --      Peak Flow --      Pain Score 10/17/16 1846 10     Pain Loc --      Pain Edu? --      Excl. in GC? --     Constitutional: Alert and oriented. Well appearing and in no distress. Eyes: Normal exam ENT   Head: Normocephalic and atraumatic.   Mouth/Throat: Mucous membranes are moist. Cardiovascular: Normal rate, regular rhythm. No murmur Respiratory: Normal respiratory effort without tachypnea nor retractions. Breath sounds are clear  Gastrointestinal: Soft and nontender. No distention.  Musculoskeletal: Nontender with normal range of motion in all extremities. No back tenderness, or erythema. Neurologic:  Normal speech and language. No gross focal neurologic deficits  Skin:  Skin is warm, dry and intact.  Psychiatric: Mood and affect are normal.   ____________________________________________    EKG  EKG reviewed and interpreted by myself shows normal sinus rhythm at 74 bpm, narrow QRS, normal axis, normal intervals, no ST changes. Reassuring EKG.  ____________________________________________    RADIOLOGY  Chest x-ray negative  ____________________________________________   INITIAL IMPRESSION / ASSESSMENT AND PLAN / ED COURSE  Pertinent labs & imaging results that were available during my care of the patient were reviewed by me and considered in my medical decision making (see chart for details).  Patient presents to the emergency department for chest pain beginning earlier today. Patient has a history of anxiety with chest pain, but due to her recent pregnancy/delivery she was not sure if this is something more concerning or if it is just her anxiety. She took her lorazepam and Klonopin today without much  relief per patient. Here the patient appears very well with a normal physical examination. Nontender chest, no lower extremity edema or tenderness. Patient has mild central chest discomfort. Patient's vitals are reassuring with a heart rate in the 70s respiratory rate around 15 and a pulse ox in the upper 90s. There is low suspicion for pulmonary embolus. Patient's heart enzyme is negative, chest x-ray is clear. Given the patient's recurrent chest pain although it is possibly or likely due to anxiety a recommended that the patient follow up with cardiology by: The number provided tomorrow to arrange a stress test and echocardiogram and patient is agreeable to this plan.  ____________________________________________   FINAL CLINICAL IMPRESSION(S) / ED DIAGNOSES  Chest pain    Minna Antis, MD 10/17/16 2016

## 2016-12-11 ENCOUNTER — Encounter: Payer: Self-pay | Admitting: Emergency Medicine

## 2016-12-11 ENCOUNTER — Emergency Department
Admission: EM | Admit: 2016-12-11 | Discharge: 2016-12-11 | Disposition: A | Discharge status: Home / Self Care | Payer: Medicaid Other | Attending: Emergency Medicine | Admitting: Emergency Medicine

## 2016-12-11 DIAGNOSIS — F410 Panic disorder [episodic paroxysmal anxiety] without agoraphobia: Secondary | ICD-10-CM | POA: Diagnosis not present

## 2016-12-11 DIAGNOSIS — F418 Other specified anxiety disorders: Secondary | ICD-10-CM | POA: Insufficient documentation

## 2016-12-11 DIAGNOSIS — F17210 Nicotine dependence, cigarettes, uncomplicated: Secondary | ICD-10-CM | POA: Insufficient documentation

## 2016-12-11 DIAGNOSIS — F419 Anxiety disorder, unspecified: Secondary | ICD-10-CM

## 2016-12-11 DIAGNOSIS — Z79899 Other long term (current) drug therapy: Secondary | ICD-10-CM | POA: Diagnosis not present

## 2016-12-11 HISTORY — DX: Post-traumatic stress disorder, unspecified: F43.10

## 2016-12-11 LAB — POCT PREGNANCY, URINE: Preg Test, Ur: NEGATIVE

## 2016-12-11 MED ORDER — HYDROXYZINE HCL 25 MG PO TABS: 25.0000 mg | ORAL_TABLET | Freq: Three times a day (TID) | ORAL | 0 refills | Status: DC | PRN

## 2016-12-11 MED ORDER — HYDROXYZINE HCL 25 MG PO TABS
25.0000 mg | ORAL_TABLET | Freq: Once | ORAL | Status: AC
  Administered 2016-12-11: 25 mg via ORAL
  Filled 2016-12-11: qty 1

## 2016-12-11 NOTE — ED Provider Notes (Signed)
[facility] Emergency Department Provider Note   ____________________________________________   I have reviewed the triage vital signs and the nursing notes.   HISTORY  Chief Complaint Anxiety    HPI Yvonne Shea is a 26 y.o. female presents to the emergency department with anxiety and a sense of electricity traveling through her body that occurred earlier this morning. Patient reports feeling stressed out however denies SI or HI. Patient has history of anxiety and PTSD and is currently being seen at [facility]. Patient reports she has to attend 3 groups before she can be seen by a provider at [facility]. Patient does not currently take anything for her anxiety although she states she has been smoking marijuana to relieve symptoms. Patient has taken Klonopin and Lexapro in the past without relieving anxiety symptoms. Patient denies any anxiety symptoms at this time although today she does endorse nausea and is concerned that she may be pregnant. Patient has a 75 month old and is not currently breast-feeding. Patient denies fever, chills, headache, vision changes, chest pain, chest tightness, shortness of breath, abdominal pain or vomiting.  Past Medical History:  Diagnosis Date  . Anxiety   . Depression   . Mental disorder   . PTSD (post-traumatic stress disorder)     Patient Active Problem List   Diagnosis Date Noted  . Post-operative state 10/13/2016  . Indication for care in labor and delivery, antepartum 10/08/2016  . Pregnancy 09/11/2016  . Preterm uterine contractions 08/09/2016  . Flank pain, acute 07/12/2016  . UTI (urinary tract infection) during pregnancy, second trimester 06/11/2016  . Panic disorder 06/11/2016  . 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 Uterine contractions during pregnancy 05/28/2016  . First trimester screening     Past Surgical History:  Procedure Laterality Date  . APPENDECTOMY    . CESAREAN SECTION    . CESAREAN  SECTION N/A 10/13/2016   Procedure: CESAREAN SECTION;  Surgeon: Feliberto Gottron, Ihor Austin, MD;  Location: [facility] ORS;  Service: Obstetrics;  Laterality: N/A;  Female born @ 35 Weight: 8lbs 1oz Apagrs: 8/9  . CHOLECYSTECTOMY    . DILATION AND CURETTAGE OF UTERUS      Prior to Admission medications   Medication Sig Start Date End Date Taking? Authorizing Provider  ciprofloxacin (CIPRO) 500 MG tablet Take 500 mg by mouth 2 (two) times daily.    [provider]  clonazePAM (KLONOPIN) 0.5 MG disintegrating tablet Take 1 tablet (0.5 mg total) by mouth 2 (two) times daily as needed for seizure (anxiety). 10/15/16   Ward, Elenora Fender, MD  escitalopram (LEXAPRO) 10 MG tablet Take 1 tablet (10 mg total) by mouth daily. 10/15/16 10/15/17  Ward, Elenora Fender, MD  ferrous sulfate 325 (65 FE) MG tablet Take 325 mg by mouth daily with breakfast.    [provider]  hydrOXYzine (ATARAX/VISTARIL) 25 MG tablet Take 1 tablet (25 mg total) by mouth 3 (three) times daily as needed. 12/11/16   Dontavia Brand M, PA-C  ibuprofen (ADVIL,MOTRIN) 600 MG tablet Take 1 tablet (600 mg total) by mouth every 6 (six) hours. 10/15/16   Ward, Elenora Fender, MD  oxyCODONE-acetaminophen (PERCOCET/ROXICET) 5-325 MG tablet Take 1 tablet by mouth every 4 (four) hours as needed (pain scale 4-7). 10/15/16   Ward, Elenora Fender, MD    Allergies Sulfa antibiotics  History reviewed. No pertinent family history.  Social History Social History  Substance Use Topics  . Smoking status: Current Every Day Smoker    Packs/day: 1.00  Types: Cigarettes  . Smokeless tobacco: Never Used  . Alcohol use No    Review of Systems Constitutional: Negative for fever/chills. Anxiety.  Eyes: No visual changes. ENT:  Negative for sore throat and for difficulty swallowing Cardiovascular: Denies chest pain. Respiratory: Denies cough. Denies shortness of breath. Gastrointestinal: No abdominal pain.  Nausea without vomiting, diarrhea. Genitourinary:  Negative for dysuria. Musculoskeletal: Negative for back pain. Skin: Negative for rash. Neurological: Negative for headaches.  Negative focal weakness or numbness. Negative for loss of consciousness. Able to ambulate. ____________________________________________   PHYSICAL EXAM:  VITAL SIGNS: ED Triage Vitals  Enc Vitals Group     BP 12/11/16 1050 113/78     Pulse Rate 12/11/16 1050 96     Resp 12/11/16 1050 18     Temp --      Temp Source 12/11/16 1050 Oral     SpO2 12/11/16 1050 100 %     Weight 12/11/16 1051 166 lb (75.3 kg)     Height --      Head Circumference --      Peak Flow --      Pain Score 12/11/16 1050 0     Pain Loc --      Pain Edu? --      Excl. in GC? --     Constitutional: Alert and oriented. Well appearing and mild distress. Very anxious. Eyes: Conjunctivae are normal. PERRL. EOMI  Head: Normocephalic and atraumatic. ENT:      Ears: Canals clear. TMs intact bilaterally.      Nose: No congestion/rhinnorhea.      Mouth/Throat: Mucous membranes are moist.  Neck:Supple. No thyromegaly. No stridor.  Cardiovascular: Normal rate, regular rhythm. Normal S1 and S2.  Good peripheral circulation. Respiratory: Normal respiratory effort without tachypnea or retractions. Lungs CTAB. No wheezes/rales/rhonchi. Good air entry to the bases with no decreased or absent breath sounds. Hematological/Lymphatic/Immunological: No cervical lymphadenopathy. Cardiovascular: Normal rate, regular rhythm. Normal distal pulses. Gastrointestinal: Bowel sounds 4 quadrants. Soft and nontender to palpation. No guarding or rigidity. No palpable masses. No distention. No CVA tenderness. Musculoskeletal: Nontender with normal range of motion in all extremities. Neurologic: Normal speech and language.  Skin:  Skin is warm, dry and intact. No rash noted. Psychiatric: Mood normal, Affect increased anxiety. Speech and behavior are normal. Patient exhibits appropriate insight and  judgement.  ____________________________________________   LABS (all labs ordered are listed, but only abnormal results are displayed)  Labs Reviewed  POC URINE PREG, ED  POCT PREGNANCY, URINE   ____________________________________________  EKG none ____________________________________________  RADIOLOGY none ____________________________________________   PROCEDURES  Procedure(s) performed: no    Critical Care performed: no ____________________________________________   INITIAL IMPRESSION / ASSESSMENT AND PLAN / ED COURSE  Pertinent labs & imaging results that were available during my care of the patient were reviewed by me and considered in my medical decision making (see chart for details).  Patient presents to emergency department with history of anxiety with experiencing a panic attack earlier this morning. History and physical exam findings are reassuring symptoms are consistent with acute panic attack. Patient responded well hydroxyzine given during the course of care in the emergency department. Patient will be prescribed hydroxyzine as needed for anxiety. Patient advised to follow up with [facility] or return to the emergency department if symptoms return or worsen. Patient informed of clinical course, understand medical decision-making process, and agree with plan.       ____________________________________________   FINAL CLINICAL IMPRESSION(S) / ED DIAGNOSES  Final diagnoses:  Anxiety  Panic attack       NEW MEDICATIONS STARTED DURING THIS VISIT:  Discharge Medication List as of 12/11/2016 12:57 PM    START taking these medications   Details  hydrOXYzine (ATARAX/VISTARIL) 25 MG tablet Take 1 tablet (25 mg total) by mouth 3 (three) times daily as needed., Starting Mon 12/11/2016, Print         Note:  This document was prepared using Dragon voice recognition software and may include unintentional dictation errors.    Clois Comber,  PA-C 12/11/16 1326    Jene Every, MD 12/11/16 [phone]

## 2016-12-11 NOTE — ED Triage Notes (Addendum)
Pt to ed with c/o anxiety this am and "stressed out"  Pt states, "I was upset and then I felt like electricity in my body for a few seconds"  Pt reports she is currently being seen at [facility] for anxiety and PTSD. Denies SI, denies HI.  Alert and oriented, denies pain or symptoms at this time.  Pt calm and appropriate.  States she usually has anxiety in restaurants and [employer], but appears calm in this ED.

## 2016-12-11 NOTE — ED Notes (Signed)
Pt advised a urine sample is needed - cup and cleaning wipe given

## 2016-12-11 NOTE — Discharge Instructions (Signed)
Take medication as prescribed. Return to emergency department if symptoms worsen and follow-up with PCP as needed.   °

## 2016-12-11 NOTE — ED Notes (Signed)
See triage note    Presents with family for anxiety  States she has a hx of anxiety was placed on Lexapro and Klonopin after the birth of her baby  But states they just make her sleepy and "they just don't work"

## 2017-02-05 ENCOUNTER — Other Ambulatory Visit: Payer: Self-pay

## 2017-02-05 DIAGNOSIS — Z634 Disappearance and death of family member: Secondary | ICD-10-CM | POA: Diagnosis not present

## 2017-02-05 DIAGNOSIS — F4321 Adjustment disorder with depressed mood: Secondary | ICD-10-CM | POA: Diagnosis not present

## 2017-02-05 DIAGNOSIS — R451 Restlessness and agitation: Secondary | ICD-10-CM | POA: Diagnosis present

## 2017-02-05 DIAGNOSIS — Z79899 Other long term (current) drug therapy: Secondary | ICD-10-CM | POA: Diagnosis not present

## 2017-02-05 DIAGNOSIS — G4700 Insomnia, unspecified: Secondary | ICD-10-CM | POA: Diagnosis not present

## 2017-02-05 DIAGNOSIS — F17210 Nicotine dependence, cigarettes, uncomplicated: Secondary | ICD-10-CM | POA: Diagnosis not present

## 2017-02-05 NOTE — ED Triage Notes (Signed)
Patient reports recent loss of child.  Reports diagnosed with PTSD, reports insomnia and having anger out burst.  Patient some what agitated in triag.

## 2017-02-06 ENCOUNTER — Emergency Department
Admission: EM | Admit: 2017-02-06 | Discharge: 2017-02-07 | Disposition: A | Discharge status: Home / Self Care | Payer: Medicaid Other | Attending: Emergency Medicine | Admitting: Emergency Medicine

## 2017-02-06 DIAGNOSIS — Z634 Disappearance and death of family member: Secondary | ICD-10-CM

## 2017-02-06 DIAGNOSIS — G4700 Insomnia, unspecified: Secondary | ICD-10-CM

## 2017-02-06 DIAGNOSIS — F4321 Adjustment disorder with depressed mood: Secondary | ICD-10-CM

## 2017-02-06 LAB — COMPREHENSIVE METABOLIC PANEL
ALK PHOS: 71 U/L (ref 38–126)
ALT: 18 U/L (ref 14–54)
ANION GAP: 11 (ref 5–15)
AST: 21 U/L (ref 15–41)
Albumin: 4.5 g/dL (ref 3.5–5.0)
BUN: 14 mg/dL (ref 6–20)
CHLORIDE: 105 mmol/L (ref 101–111)
CO2: 22 mmol/L (ref 22–32)
Calcium: 9.1 mg/dL (ref 8.9–10.3)
Creatinine, Ser: 0.66 mg/dL (ref 0.44–1.00)
Glucose, Bld: 94 mg/dL (ref 65–99)
POTASSIUM: 3.6 mmol/L (ref 3.5–5.1)
SODIUM: 138 mmol/L (ref 135–145)
Total Bilirubin: 0.7 mg/dL (ref 0.3–1.2)
Total Protein: 8.4 g/dL — ABNORMAL HIGH (ref 6.5–8.1)

## 2017-02-06 LAB — ACETAMINOPHEN LEVEL

## 2017-02-06 LAB — CBC
HCT: 41 % (ref 35.0–47.0)
Hemoglobin: 13.6 g/dL (ref 12.0–16.0)
MCH: 27 pg (ref 26.0–34.0)
MCHC: 33.1 g/dL (ref 32.0–36.0)
MCV: 81.7 fL (ref 80.0–100.0)
PLATELETS: 240 10*3/uL (ref 150–440)
RBC: 5.02 MIL/uL (ref 3.80–5.20)
RDW: 15 % — ABNORMAL HIGH (ref 11.5–14.5)
WBC: 9.5 10*3/uL (ref 3.6–11.0)

## 2017-02-06 LAB — ETHANOL

## 2017-02-06 LAB — SALICYLATE LEVEL

## 2017-02-06 MED ORDER — DIPHENHYDRAMINE HCL 25 MG PO TABS: 50.0000 mg | ORAL_TABLET | Freq: Every evening | ORAL | 0 refills | Status: AC | PRN

## 2017-02-06 NOTE — ED Provider Notes (Signed)
[facility] Emergency Department Provider Note  ____________________________________________  Time seen: Approximately 4:55 AM  I have reviewed the triage vital signs and the nursing notes.   HISTORY  Chief Complaint Agitation    HPI Yvonne Shea is a 26 y.o. female who complains of insomnia, mood and anger issues, poor appetite, poor volition. This is in the setting of having recently suffered the death of her 301-month-old son a few weeks ago. Denies SI HI or hallucinations. She is following up with [facility] but is dissatisfied that they have not started her on any medications at this time. Her primary concern is her difficulty sleeping. She tried taking her husband's trazodone without relief.     Past Medical History:  Diagnosis Date  . Anxiety   . Depression   . Mental disorder   . PTSD (post-traumatic stress disorder)      Patient Active Problem List   Diagnosis Date Noted  . Post-operative state 10/13/2016  . Indication for care in labor and delivery, antepartum 10/08/2016  . Pregnancy 09/11/2016  . Preterm uterine contractions 08/09/2016  . Flank pain, acute 07/12/2016  . UTI (urinary tract infection) during pregnancy, second trimester 06/11/2016  . Panic disorder 06/11/2016  . 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 Uterine contractions during pregnancy 05/28/2016  . First trimester screening      Past Surgical History:  Procedure Laterality Date  . APPENDECTOMY    . CESAREAN SECTION    . CHOLECYSTECTOMY    . DILATION AND CURETTAGE OF UTERUS       Prior to Admission medications   Medication Sig Start Date End Date Taking? Authorizing Provider  ciprofloxacin (CIPRO) 500 MG tablet Take 500 mg by mouth 2 (two) times daily.    [provider]  clonazePAM (KLONOPIN) 0.5 MG disintegrating tablet Take 1 tablet (0.5 mg total) by mouth 2 (two) times daily as needed for seizure (anxiety). 10/15/16   Ward, Elenora Fenderhelsea C, MD   diphenhydrAMINE (BENADRYL) 25 MG tablet Take 2 tablets (50 mg total) at bedtime as needed by mouth for sleep. 02/06/17   Sharman CheekStafford, Weslee Fogg, MD  escitalopram (LEXAPRO) 10 MG tablet Take 1 tablet (10 mg total) by mouth daily. 10/15/16 10/15/17  Ward, Elenora Fenderhelsea C, MD  ferrous sulfate 325 (65 FE) MG tablet Take 325 mg by mouth daily with breakfast.    [provider]  hydrOXYzine (ATARAX/VISTARIL) 25 MG tablet Take 1 tablet (25 mg total) by mouth 3 (three) times daily as needed. 12/11/16   Little, Traci M, PA-C  ibuprofen (ADVIL,MOTRIN) 600 MG tablet Take 1 tablet (600 mg total) by mouth every 6 (six) hours. 10/15/16   Ward, Elenora Fenderhelsea C, MD  oxyCODONE-acetaminophen (PERCOCET/ROXICET) 5-325 MG tablet Take 1 tablet by mouth every 4 (four) hours as needed (pain scale 4-7). 10/15/16   Ward, Elenora Fenderhelsea C, MD     Allergies Sulfa antibiotics   No family history on file.  Social History Social History   Tobacco Use  . Smoking status: Current Every Day Smoker    Packs/day: 1.00    Types: Cigarettes  . Smokeless tobacco: Never Used  Substance Use Topics  . Alcohol use: No  . Drug use: No    Review of Systems  Constitutional:   No fever or chills.  ENT:   No sore throat. No rhinorrhea. Cardiovascular:   No chest pain or syncope. Respiratory:   No dyspnea or cough. Gastrointestinal:   Negative for abdominal pain, vomiting and diarrhea.  Musculoskeletal:  Negative for focal pain or swelling All other systems reviewed and are negative except as documented above in ROS and HPI.  ____________________________________________   PHYSICAL EXAM:  VITAL SIGNS: ED Triage Vitals  Enc Vitals Group     BP 02/06/17 0026 (!) 96/49     Pulse Rate 02/06/17 0026 84     Resp 02/06/17 0026 (!) 22     Temp 02/06/17 0026 97.9 F (36.6 C)     Temp Source 02/06/17 0026 Oral     SpO2 02/06/17 0026 99 %     Weight 02/05/17 2356 150 lb (68 kg)     Height 02/05/17 2356 5\' 4"  (1.626 m)     Head  Circumference --      Peak Flow --      Pain Score --      Pain Loc --      Pain Edu? --      Excl. in GC? --     Vital signs reviewed, nursing assessments reviewed.   Constitutional:   Alert and oriented. Well appearing and in no distress. Eyes:   No scleral icterus.  EOMI. No nystagmus. No conjunctival pallor. PERRL. ENT   Head:   Normocephalic and atraumatic.   Nose:   No congestion/rhinnorhea.    Mouth/Throat:   MMM, no pharyngeal erythema. No peritonsillar mass.    Neck:   No meningismus. Full ROM. Hematological/Lymphatic/Immunilogical:   No cervical lymphadenopathy. Cardiovascular:   RRR. Symmetric bilateral radial and DP pulses.  No murmurs.  Respiratory:   Normal respiratory effort without tachypnea/retractions. Breath sounds are clear and equal bilaterally. No wheezes/rales/rhonchi. Gastrointestinal:   Soft and nontender. Non distended. There is no CVA tenderness.  No rebound, rigidity, or guarding. Genitourinary:   deferred Musculoskeletal:   Normal range of motion in all extremities. No joint effusions.  No lower extremity tenderness.  No edema. Neurologic:   Normal speech and language.  Motor grossly intact. No gross focal neurologic deficits are appreciated.  Skin:    Skin is warm, dry and intact. No rash noted.  No petechiae, purpura, or bullae.  ____________________________________________    LABS (pertinent positives/negatives) (all labs ordered are listed, but only abnormal results are displayed) Labs Reviewed  COMPREHENSIVE METABOLIC PANEL - Abnormal; Notable for the following components:      Result Value   Total Protein 8.4 (*)    All other components within normal limits  ACETAMINOPHEN LEVEL - Abnormal; Notable for the following components:   Acetaminophen (Tylenol), Serum <10 (*)    All other components within normal limits  CBC - Abnormal; Notable for the following components:   RDW 15.0 (*)    All other components within normal limits   ETHANOL  SALICYLATE LEVEL  URINE DRUG SCREEN, QUALITATIVE ([facility] ONLY)  POC URINE PREG, ED   ____________________________________________   EKG    ____________________________________________    RADIOLOGY  No results found.  ____________________________________________   PROCEDURES Procedures  ____________________________________________     CLINICAL IMPRESSION / ASSESSMENT AND PLAN / ED COURSE  Pertinent labs & imaging results that were available during my care of the patient were reviewed by me and considered in my medical decision making (see chart for details).   Patient well-appearing no acute distress, no focal somatic symptoms. Does have multiple features to suggest depression, not unexpected given the recent events in her life with the death of her infant. Not a danger to herself or others. I offered the patient is psychiatry consult for medication recommendations  as I do think that she might benefit from a low dose of an antidepressant such as Celexa or Zoloft. The patient does report having taken multiple different antidepressants in the past and having intolerable side effects. However, in the process of waiting for the psychiatry consult to be completed, the patient's dates this is tired of waiting and just wants to go home so should be discharged. She is medically and psychiatrically stable. She has been following up with [facility] in the past and I recommend she continues to do so.      ____________________________________________   FINAL CLINICAL IMPRESSION(S) / ED DIAGNOSES    Final diagnoses:  Grief at loss of child  Insomnia, unspecified type      This SmartLink is deprecated. Use AVSMEDLIST instead to display the medication list for a patient.   Portions of this note were generated with dragon dictation software. Dictation errors may occur despite best attempts at proofreading.    Sharman CheekStafford, Cattie Tineo, MD 02/06/17 [phone]

## 2017-02-06 NOTE — ED Notes (Signed)
Pt reports her son died recently and he was 613 months old.  Pt is having problems with sleeping, eating and anger issues. Denies SI or HI.  Pt reports smoking marijuana to help sleep.  Denies etoh use.  Pt tearful at times.  Pt unable to void at this time.

## 2017-02-06 NOTE — ED Notes (Signed)
Report off to iris rn  

## 2017-02-06 NOTE — ED Notes (Signed)
Spoke with Dr. Zenda AlpersWebster regarding patient.  Instructed to obtain labs per behavioral protocol, but patient does not need to dress out at this point.

## 2017-06-04 ENCOUNTER — Emergency Department
Admission: EM | Admit: 2017-06-04 | Discharge: 2017-06-04 | Disposition: A | Discharge status: Home / Self Care | Payer: Medicaid Other | Attending: Emergency Medicine | Admitting: Emergency Medicine

## 2017-06-04 ENCOUNTER — Emergency Department: Payer: Medicaid Other

## 2017-06-04 ENCOUNTER — Encounter: Payer: Self-pay | Admitting: Emergency Medicine

## 2017-06-04 DIAGNOSIS — Y999 Unspecified external cause status: Secondary | ICD-10-CM | POA: Diagnosis not present

## 2017-06-04 DIAGNOSIS — Y939 Activity, unspecified: Secondary | ICD-10-CM | POA: Diagnosis not present

## 2017-06-04 DIAGNOSIS — Y929 Unspecified place or not applicable: Secondary | ICD-10-CM | POA: Diagnosis not present

## 2017-06-04 DIAGNOSIS — S39012A Strain of muscle, fascia and tendon of lower back, initial encounter: Secondary | ICD-10-CM | POA: Diagnosis not present

## 2017-06-04 DIAGNOSIS — X58XXXA Exposure to other specified factors, initial encounter: Secondary | ICD-10-CM | POA: Diagnosis not present

## 2017-06-04 DIAGNOSIS — F17210 Nicotine dependence, cigarettes, uncomplicated: Secondary | ICD-10-CM | POA: Insufficient documentation

## 2017-06-04 DIAGNOSIS — Z79899 Other long term (current) drug therapy: Secondary | ICD-10-CM | POA: Diagnosis not present

## 2017-06-04 DIAGNOSIS — S3982XA Other specified injuries of lower back, initial encounter: Secondary | ICD-10-CM | POA: Diagnosis present

## 2017-06-04 LAB — BASIC METABOLIC PANEL
Anion gap: 8 (ref 5–15)
BUN: 11 mg/dL (ref 6–20)
CALCIUM: 9 mg/dL (ref 8.9–10.3)
CO2: 23 mmol/L (ref 22–32)
Chloride: 106 mmol/L (ref 101–111)
Creatinine, Ser: 0.64 mg/dL (ref 0.44–1.00)
GFR calc non Af Amer: >60 mL/min (ref >60)
GLUCOSE: 89 mg/dL (ref 65–99)
Potassium: 4.1 mmol/L (ref 3.5–5.1)
Sodium: 137 mmol/L (ref 135–145)

## 2017-06-04 LAB — CBC
HCT: 41.7 % (ref 35.0–47.0)
Hemoglobin: 14.3 g/dL (ref 12.0–16.0)
MCH: 28.6 pg (ref 26.0–34.0)
MCHC: 34.2 g/dL (ref 32.0–36.0)
MCV: 83.5 fL (ref 80.0–100.0)
PLATELETS: 225 10*3/uL (ref 150–440)
RBC: 5 MIL/uL (ref 3.80–5.20)
RDW: 15.6 % — ABNORMAL HIGH (ref 11.5–14.5)
WBC: 10.7 10*3/uL (ref 3.6–11.0)

## 2017-06-04 LAB — HCG, QUANTITATIVE, PREGNANCY: hCG, Beta Chain, Quant, S: 1 m[IU]/mL (ref <5)

## 2017-06-04 LAB — URINALYSIS, COMPLETE (UACMP) WITH MICROSCOPIC
Bilirubin Urine: NEGATIVE
GLUCOSE, UA: NEGATIVE mg/dL
Ketones, ur: NEGATIVE mg/dL
Leukocytes, UA: NEGATIVE
Nitrite: NEGATIVE
PH: 7 (ref 5.0–8.0)
Protein, ur: NEGATIVE mg/dL
SPECIFIC GRAVITY, URINE: 1.017 (ref 1.005–1.030)

## 2017-06-04 LAB — TROPONIN I: Troponin I: <0.03 ng/mL (ref <0.03)

## 2017-06-04 LAB — POCT PREGNANCY, URINE: PREG TEST UR: NEGATIVE

## 2017-06-04 MED ORDER — GI COCKTAIL ~~LOC~~
30.0000 mL | Freq: Once | ORAL | Status: AC
Start: 2017-06-04 — End: 2017-06-04
  Administered 2017-06-04: 30 mL via ORAL
  Filled 2017-06-04: qty 30

## 2017-06-04 MED ORDER — NAPROXEN 500 MG PO TABS: 500.0000 mg | ORAL_TABLET | Freq: Two times a day (BID) | ORAL | 2 refills | Status: DC

## 2017-06-04 MED ORDER — ONDANSETRON 4 MG PO TBDP
4.0000 mg | ORAL_TABLET | Freq: Once | ORAL | Status: AC
  Administered 2017-06-04: 4 mg via ORAL

## 2017-06-04 MED ORDER — KETOROLAC TROMETHAMINE 30 MG/ML IJ SOLN
30.0000 mg | Freq: Once | INTRAMUSCULAR | Status: DC
  Filled 2017-06-04: qty 1

## 2017-06-04 MED ORDER — KETOROLAC TROMETHAMINE 30 MG/ML IJ SOLN
30.0000 mg | Freq: Once | INTRAMUSCULAR | Status: AC
  Administered 2017-06-04: 30 mg via INTRAMUSCULAR

## 2017-06-04 NOTE — ED Provider Notes (Signed)
[facility] Emergency Department Provider Note   ____________________________________________    I have reviewed the triage vital signs and the nursing notes.   HISTORY  Chief Complaint Chest Pain and Back Pain     HPI Yvonne Shea is a 27 y.o. female who presents with primary complaint of back pain.  Patient reports pain in her mid and low back which seems to be worse with certain movements.  Symptoms have been ongoing for several days now.  She has not taken anything for this.  She also reports intermittent occasional chest discomfort but none currently.  Denies shortness of breath or pleurisy.  No fevers or chills.  No nausea or vomiting or abdominal pain.  She is concerned that she may be pregnant and would like a blood test because "urine tests are not accurate on her "  Past Medical History:  Diagnosis Date  . Anxiety   . Depression   . Mental disorder   . PTSD (post-traumatic stress disorder)     Patient Active Problem List   Diagnosis Date Noted  . Post-operative state 10/13/2016  . Indication for care in labor and delivery, antepartum 10/08/2016  . Pregnancy 09/11/2016  . Preterm uterine contractions 08/09/2016  . Flank pain, acute 07/12/2016  . UTI (urinary tract infection) during pregnancy, second trimester 06/11/2016  . Panic disorder 06/11/2016  . 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 Uterine contractions during pregnancy 05/28/2016  . First trimester screening     Past Surgical History:  Procedure Laterality Date  . APPENDECTOMY    . CESAREAN SECTION    . CESAREAN SECTION N/A 10/13/2016   Procedure: CESAREAN SECTION;  Surgeon: Feliberto GottronSchermerhorn, Ihor Austinhomas J, MD;  Location: [facility] ORS;  Service: Obstetrics;  Laterality: N/A;  Female born @ [id number] Weight: 8lbs 1oz Apagrs: 8/9  . CHOLECYSTECTOMY    . DILATION AND CURETTAGE OF UTERUS      Prior to Admission medications   Medication Sig Start Date End Date Taking?  Authorizing Provider  clonazePAM (KLONOPIN) 0.5 MG disintegrating tablet Take 1 tablet (0.5 mg total) by mouth 2 (two) times daily as needed for seizure (anxiety). Patient not taking: Reported on 06/04/2017 10/15/16   Ward, Elenora Fenderhelsea C, MD  diphenhydrAMINE (BENADRYL) 25 MG tablet Take 2 tablets (50 mg total) at bedtime as needed by mouth for sleep. Patient not taking: Reported on 06/04/2017 02/06/17   Sharman CheekStafford, Phillip, MD  escitalopram (LEXAPRO) 10 MG tablet Take 1 tablet (10 mg total) by mouth daily. Patient not taking: Reported on 06/04/2017 10/15/16 10/15/17  Ward, Elenora Fenderhelsea C, MD  hydrOXYzine (ATARAX/VISTARIL) 25 MG tablet Take 1 tablet (25 mg total) by mouth 3 (three) times daily as needed. Patient not taking: Reported on 06/04/2017 12/11/16   Little, Traci M, PA-C  ibuprofen (ADVIL,MOTRIN) 600 MG tablet Take 1 tablet (600 mg total) by mouth every 6 (six) hours. Patient not taking: Reported on 06/04/2017 10/15/16   Ward, Elenora Fenderhelsea C, MD  naproxen (NAPROSYN) 500 MG tablet Take 1 tablet (500 mg total) by mouth 2 (two) times daily with a meal. 06/04/17   Jene EveryKinner, Barby Colvard, MD  oxyCODONE-acetaminophen (PERCOCET/ROXICET) 5-325 MG tablet Take 1 tablet by mouth every 4 (four) hours as needed (pain scale 4-7). Patient not taking: Reported on 06/04/2017 10/15/16   Ward, Elenora Fenderhelsea C, MD     Allergies Sulfa antibiotics  No family history on file.  Social History Social History   Tobacco Use  . Smoking status: Current Every Day Smoker  Packs/day: 1.00    Types: Cigarettes  . Smokeless tobacco: Never Used  Substance Use Topics  . Alcohol use: No  . Drug use: No    Review of Systems  Constitutional: No fever/chills Eyes: No visual changes.  ENT: No sore throat. Cardiovascular: As above Respiratory: Denies shortness of breath. Gastrointestinal: No abdominal pain.  No nausea, no vomiting.   Genitourinary: Negative for dysuria. Musculoskeletal: As above Skin: Negative for rash. Neurological: Negative for  headaches    ____________________________________________   PHYSICAL EXAM:  VITAL SIGNS: ED Triage Vitals  Enc Vitals Group     BP 06/04/17 1250 (!) 85/63     Pulse Rate 06/04/17 1250 79     Resp 06/04/17 1250 20     Temp 06/04/17 1250 98.1 F (36.7 C)     Temp Source 06/04/17 1250 Oral     SpO2 06/04/17 1250 99 %     Weight 06/04/17 1251 72.6 kg (160 lb)     Height 06/04/17 1251 1.626 m (5\' 4" )     Head Circumference --      Peak Flow --      Pain Score 06/04/17 1251 10     Pain Loc --      Pain Edu? --      Excl. in GC? --     Constitutional: Alert and oriented. No acute distress. Pleasant and interactive  Nose: No congestion/rhinnorhea. Mouth/Throat: Mucous membranes are moist.    Cardiovascular: Normal rate, regular rhythm. Grossly normal heart sounds.  Good peripheral circulation. Respiratory: Normal respiratory effort.  No retractions. Lungs CTAB. Gastrointestinal: Soft and nontender. No distention.  No CVA tenderness. Genitourinary: deferred Musculoskeletal: Back: Tenderness along paraspinal muscles from L5 bilaterally to approximately T5, this appears to replicate her symptoms.  No vertebral tenderness to palpation warm and well perfused Neurologic:  Normal speech and language. No gross focal neurologic deficits are appreciated.  Skin:  Skin is warm, dry and intact. No rash noted. Psychiatric: Mood and affect are normal. Speech and behavior are normal.  ____________________________________________   LABS (all labs ordered are listed, but only abnormal results are displayed)  Labs Reviewed  CBC - Abnormal; Notable for the following components:      Result Value   RDW 15.6 (*)    All other components within normal limits  URINALYSIS, COMPLETE (UACMP) WITH MICROSCOPIC - Abnormal; Notable for the following components:   Color, Urine YELLOW (*)    APPearance HAZY (*)    Hgb urine dipstick SMALL (*)    Bacteria, UA RARE (*)    Squamous Epithelial / LPF 6-30  (*)    All other components within normal limits  BASIC METABOLIC PANEL  TROPONIN I  HCG, QUANTITATIVE, PREGNANCY  POC URINE PREG, ED  POCT PREGNANCY, URINE   ____________________________________________  EKG   ____________________________________________  RADIOLOGY  Chest x-ray normal ____________________________________________   PROCEDURES  Procedure(s) performed: No  Procedures   Critical Care performed: No ____________________________________________   INITIAL IMPRESSION / ASSESSMENT AND PLAN / ED COURSE  Pertinent labs & imaging results that were available during my care of the patient were reviewed by me and considered in my medical decision making (see chart for details).  Patient well-appearing in no acute distress.  Exam is most consistent with musculoskeletal back pain, will treat with IV Toradol.  Will check beta hCG at the patient's request although I doubt pregnancy given normal periods.  Lab work unremarkable, suspect gastritis/esophagitis as the cause of her intermittent chest discomfort,  ____________________________________________   FINAL CLINICAL IMPRESSION(S) / ED DIAGNOSES  Final diagnoses:  Strain of lumbar region, initial encounter        Note:  This document was prepared using Dragon voice recognition software and may include unintentional dictation errors.    Jene Every, MD 06/04/17 [phone]

## 2017-06-04 NOTE — ED Triage Notes (Signed)
Pt reports intermittent chest pain and back pain since last week. Pt reports when the pain comes on it is severe in nature. Pt reports pain is mid epigastric are and radiates through to her back. Pt denies all other sx's.

## 2017-09-14 ENCOUNTER — Emergency Department: Payer: Medicaid Other

## 2017-09-14 ENCOUNTER — Emergency Department
Admission: EM | Admit: 2017-09-14 | Discharge: 2017-09-15 | Disposition: A | Discharge status: Home / Self Care | Payer: Medicaid Other | Attending: Emergency Medicine | Admitting: Emergency Medicine

## 2017-09-14 ENCOUNTER — Encounter: Payer: Self-pay | Admitting: Emergency Medicine

## 2017-09-14 DIAGNOSIS — R0789 Other chest pain: Secondary | ICD-10-CM

## 2017-09-14 DIAGNOSIS — Z733 Stress, not elsewhere classified: Secondary | ICD-10-CM | POA: Diagnosis not present

## 2017-09-14 DIAGNOSIS — F17210 Nicotine dependence, cigarettes, uncomplicated: Secondary | ICD-10-CM | POA: Insufficient documentation

## 2017-09-14 DIAGNOSIS — Z79899 Other long term (current) drug therapy: Secondary | ICD-10-CM | POA: Diagnosis not present

## 2017-09-14 LAB — CBC
HEMATOCRIT: 40.4 % (ref 35.0–47.0)
HEMOGLOBIN: 14.1 g/dL (ref 12.0–16.0)
MCH: 29.4 pg (ref 26.0–34.0)
MCHC: 35 g/dL (ref 32.0–36.0)
MCV: 84.1 fL (ref 80.0–100.0)
PLATELETS: 222 10*3/uL (ref 150–440)
RBC: 4.8 MIL/uL (ref 3.80–5.20)
RDW: 15 % — ABNORMAL HIGH (ref 11.5–14.5)
WBC: 8.3 10*3/uL (ref 3.6–11.0)

## 2017-09-14 LAB — BASIC METABOLIC PANEL
Anion gap: 9 (ref 5–15)
BUN: 8 mg/dL (ref 6–20)
CHLORIDE: 103 mmol/L (ref 101–111)
CO2: 24 mmol/L (ref 22–32)
CREATININE: 0.6 mg/dL (ref 0.44–1.00)
Calcium: 9 mg/dL (ref 8.9–10.3)
GFR calc non Af Amer: >60 mL/min (ref >60)
Glucose, Bld: 130 mg/dL — ABNORMAL HIGH (ref 65–99)
POTASSIUM: 3.5 mmol/L (ref 3.5–5.1)
SODIUM: 136 mmol/L (ref 135–145)

## 2017-09-14 LAB — POCT PREGNANCY, URINE: Preg Test, Ur: NEGATIVE

## 2017-09-14 LAB — TROPONIN I: Troponin I: <0.03 ng/mL (ref <0.03)

## 2017-09-14 NOTE — Discharge Instructions (Signed)

## 2017-09-14 NOTE — ED Triage Notes (Signed)
Pt states she has had CP for a week today its much worse.  Pt does not report any other sx.  Pt denies drug use.  Pt also is reporting fatigue.

## 2017-09-14 NOTE — ED Provider Notes (Signed)
[facility] Emergency Department Provider Note  ____________________________________________   First MD Initiated Contact with Patient 09/14/17 2328     (approximate)  I have reviewed the triage vital signs and the nursing notes.   HISTORY  Chief Complaint Chest Pain    HPI Yvonne Shea is a 27 y.o. female with chronic psychiatric issues as listed below but with no chronic medical issues.  She presents for evaluation of about 2 weeks of anterior chest pain that sometimes radiates down both arms.  She states that she thinks her anxiety is playing a role.  She has been going through a very stressful time after her child passed away a few months ago and she has been having issues with her ex-husband.  She says that the episodes happen more or less daily and nothing in particular makes it better or worse, except that possibly increased stress makes the symptoms worse.  She can reproduce the pain by pressing on the front of her chest.  Aleve does not seem to be helping.  She denies shortness of breath, nausea, vomiting, abdominal pain, and dysuria.  She has had no fever or chills.  Exertion does not make the symptoms worse.  She has no history of blood clots in the legs of the lungs.  She is not on any birth control or exogenous estrogen.  No recent immobilizations or surgeries.  Past Medical History:  Diagnosis Date  . Anxiety   . Depression   . Mental disorder   . PTSD (post-traumatic stress disorder)     Patient Active Problem List   Diagnosis Date Noted  . Post-operative state 10/13/2016  . Indication for care in labor and delivery, antepartum 10/08/2016  . Pregnancy 09/11/2016  . Preterm uterine contractions 08/09/2016  . Flank pain, acute 07/12/2016  . UTI (urinary tract infection) during pregnancy, second trimester 06/11/2016  . Panic disorder 06/11/2016  . 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 Uterine contractions during pregnancy  05/28/2016  . First trimester screening     Past Surgical History:  Procedure Laterality Date  . APPENDECTOMY    . CESAREAN SECTION    . CESAREAN SECTION N/A 10/13/2016   Procedure: CESAREAN SECTION;  Surgeon: Feliberto Gottron, Ihor Austin, MD;  Location: [facility] ORS;  Service: Obstetrics;  Laterality: N/A;  Female born @ 37 Weight: 8lbs 1oz Apagrs: 8/9  . CHOLECYSTECTOMY    . DILATION AND CURETTAGE OF UTERUS      Prior to Admission medications   Medication Sig Start Date End Date Taking? Authorizing Provider  clonazePAM (KLONOPIN) 0.5 MG disintegrating tablet Take 1 tablet (0.5 mg total) by mouth 2 (two) times daily as needed for seizure (anxiety). Patient not taking: Reported on 06/04/2017 10/15/16   Ward, Elenora Fender, MD  diphenhydrAMINE (BENADRYL) 25 MG tablet Take 2 tablets (50 mg total) at bedtime as needed by mouth for sleep. Patient not taking: Reported on 06/04/2017 02/06/17   Sharman Cheek, MD  escitalopram (LEXAPRO) 10 MG tablet Take 1 tablet (10 mg total) by mouth daily. Patient not taking: Reported on 06/04/2017 10/15/16 10/15/17  Ward, Elenora Fender, MD  hydrOXYzine (ATARAX/VISTARIL) 25 MG tablet Take 1 tablet (25 mg total) by mouth 3 (three) times daily as needed. Patient not taking: Reported on 06/04/2017 12/11/16   Little, Traci M, PA-C  ibuprofen (ADVIL,MOTRIN) 600 MG tablet Take 1 tablet (600 mg total) by mouth every 6 (six) hours. Patient not taking: Reported on 06/04/2017 10/15/16   Ward, Elenora Fender, MD  naproxen (NAPROSYN) 500 MG tablet Take 1 tablet (500 mg total) by mouth 2 (two) times daily with a meal. 06/04/17   Jene EveryKinner, Robert, MD  oxyCODONE-acetaminophen (PERCOCET/ROXICET) 5-325 MG tablet Take 1 tablet by mouth every 4 (four) hours as needed (pain scale 4-7). Patient not taking: Reported on 06/04/2017 10/15/16   Ward, Elenora Fenderhelsea C, MD    Allergies Sulfa antibiotics  No family history on file.  Social History Social History   Tobacco Use  . Smoking status: Current Every Day  Smoker    Packs/day: 1.00    Types: Cigarettes  . Smokeless tobacco: Never Used  Substance Use Topics  . Alcohol use: No  . Drug use: No    Review of Systems Constitutional: No fever/chills Eyes: No visual changes. ENT: No sore throat. Cardiovascular: Anterior chest wall pain for several weeks as described above Respiratory: Denies shortness of breath. Gastrointestinal: No abdominal pain.  No nausea, no vomiting.  No diarrhea.  No constipation. Genitourinary: Negative for dysuria. Musculoskeletal: Negative for neck pain.  Negative for back pain. Integumentary: Negative for rash. Neurological: Negative for headaches, focal weakness or numbness. Psych:  recently worsening anxiety and stress, no SI/HI  ____________________________________________   PHYSICAL EXAM:  VITAL SIGNS: ED Triage Vitals  Enc Vitals Group     BP 09/14/17 1951 122/69     Pulse Rate 09/14/17 1951 77     Resp 09/14/17 1951 18     Temp 09/14/17 1951 98.2 F (36.8 C)     Temp Source 09/14/17 1951 Oral     SpO2 09/14/17 1951 100 %     Weight --      Height --      Head Circumference --      Peak Flow --      Pain Score 09/14/17 1954 7     Pain Loc --      Pain Edu? --      Excl. in GC? --     Constitutional: Alert and oriented. Well appearing and in no acute distress. Eyes: Conjunctivae are normal.  Head: Atraumatic. Nose: No congestion/rhinnorhea. Mouth/Throat: Mucous membranes are moist. Neck: No stridor.  No meningeal signs.   Cardiovascular: Normal rate, regular rhythm. Good peripheral circulation. Grossly normal heart sounds.  Mildly reproducible anterior chest wall tenderness Respiratory: Normal respiratory effort.  No retractions. Lungs CTAB. Gastrointestinal: Soft and nontender. No distention.  Musculoskeletal: No lower extremity tenderness nor edema. No gross deformities of extremities. Neurologic:  Normal speech and language. No gross focal neurologic deficits are appreciated.  Skin:   Skin is warm, dry and intact. No rash noted. Psychiatric: Mood and affect are normal. Speech and behavior are normal.  ____________________________________________   LABS (all labs ordered are listed, but only abnormal results are displayed)  Labs Reviewed  BASIC METABOLIC PANEL - Abnormal; Notable for the following components:      Result Value   Glucose, Bld 130 (*)    All other components within normal limits  CBC - Abnormal; Notable for the following components:   RDW 15.0 (*)    All other components within normal limits  TROPONIN I  POCT PREGNANCY, URINE  POC URINE PREG, ED   ____________________________________________  EKG  ED ECG REPORT I, Loleta Roseory Azzure Garabedian, the attending physician, personally viewed and interpreted this ECG.  Date: 09/14/2017 EKG Time: 19: 13 Rate: 83 Rhythm: normal sinus rhythm QRS Axis: normal Intervals: normal ST/T Wave abnormalities: normal Narrative Interpretation: no evidence of acute ischemia  ____________________________________________  RADIOLOGY I,  Loleta Rose, personally viewed and evaluated these images (plain radiographs) as part of my medical decision making, as well as reviewing the written report by the radiologist.  ED MD interpretation: No acute abnormalities on chest x-ray  Official radiology report(s): Dg Chest 2 View  Result Date: 09/14/2017 CLINICAL DATA:  Chest pain EXAM: CHEST - 2 VIEW COMPARISON:  06/04/2016 FINDINGS: The heart size and mediastinal contours are within normal limits. Both lungs are clear. The visualized skeletal structures are unremarkable. IMPRESSION: No active cardiopulmonary disease. Electronically Signed   By: Jasmine Pang M.D.   On: 09/14/2017 20:25    ____________________________________________   PROCEDURES  Critical Care performed: No   Procedure(s) performed:   Procedures   ____________________________________________   INITIAL IMPRESSION / ASSESSMENT AND PLAN / ED COURSE  As  part of my medical decision making, I reviewed the following data within the electronic MEDICAL RECORD NUMBER Nursing notes reviewed and incorporated, Labs reviewed , EKG interpreted , Old chart reviewed, Radiograph reviewed  and Notes from prior ED visits    Differential diagnosis includes, but is not limited to, musculoskeletal chest wall pain, costochondritis, anxiety/stress, pericarditis, pneumonia, PE.  Fortunately the patient's work-up is reassuring.  Her vital signs are all within normal limits, basic metabolic panel is normal, troponin negative,  CBC normal.  Urine pregnancy test is negative.  Chest x-ray is clear and EKG is normal with no evidence of ischemia.  She is PERC negative and low risk for ACS based on history and HEART score.    The patient feels better after knowing the results of her tests and I explained to her that I agree that this is most likely stress related but also possibly component of muscular skeletal pain.  I gave her my usual and customary chest wall pain recommendations and encouraged outpatient follow-up.  I also provided an outpatient resource guide because she does not like the care she has received at [facility].  I gave my usual and customary return precautions.      ____________________________________________  FINAL CLINICAL IMPRESSION(S) / ED DIAGNOSES  Final diagnoses:  Chest wall pain  Atypical chest pain     MEDICATIONS GIVEN DURING THIS VISIT:  Medications - No data to display   ED Discharge Orders    None       Note:  This document was prepared using Dragon voice recognition software and may include unintentional dictation errors.    Loleta Rose, MD 09/14/17 [phone]

## 2017-11-15 ENCOUNTER — Emergency Department
Admission: EM | Admit: 2017-11-15 | Discharge: 2017-11-15 | Disposition: A | Discharge status: Home / Self Care | Payer: Medicaid Other | Attending: Emergency Medicine | Admitting: Emergency Medicine

## 2017-11-15 ENCOUNTER — Other Ambulatory Visit: Payer: Self-pay

## 2017-11-15 ENCOUNTER — Emergency Department: Payer: Medicaid Other

## 2017-11-15 DIAGNOSIS — M546 Pain in thoracic spine: Secondary | ICD-10-CM | POA: Diagnosis present

## 2017-11-15 DIAGNOSIS — M545 Low back pain: Secondary | ICD-10-CM | POA: Insufficient documentation

## 2017-11-15 DIAGNOSIS — Z79899 Other long term (current) drug therapy: Secondary | ICD-10-CM | POA: Insufficient documentation

## 2017-11-15 DIAGNOSIS — K047 Periapical abscess without sinus: Secondary | ICD-10-CM | POA: Diagnosis not present

## 2017-11-15 DIAGNOSIS — F17210 Nicotine dependence, cigarettes, uncomplicated: Secondary | ICD-10-CM | POA: Diagnosis not present

## 2017-11-15 DIAGNOSIS — M549 Dorsalgia, unspecified: Secondary | ICD-10-CM

## 2017-11-15 LAB — BASIC METABOLIC PANEL
Anion gap: 7 (ref 5–15)
BUN: 8 mg/dL (ref 6–20)
CHLORIDE: 108 mmol/L (ref 98–111)
CO2: 24 mmol/L (ref 22–32)
Calcium: 8.8 mg/dL — ABNORMAL LOW (ref 8.9–10.3)
Creatinine, Ser: 0.56 mg/dL (ref 0.44–1.00)
GFR calc non Af Amer: >60 mL/min (ref >60)
Glucose, Bld: 102 mg/dL — ABNORMAL HIGH (ref 70–99)
POTASSIUM: 3.6 mmol/L (ref 3.5–5.1)
Sodium: 139 mmol/L (ref 135–145)

## 2017-11-15 LAB — URINALYSIS, COMPLETE (UACMP) WITH MICROSCOPIC
Bacteria, UA: NONE SEEN
Bilirubin Urine: NEGATIVE
Glucose, UA: NEGATIVE mg/dL
HGB URINE DIPSTICK: NEGATIVE
KETONES UR: NEGATIVE mg/dL
Leukocytes, UA: NEGATIVE
NITRITE: NEGATIVE
PH: 6 (ref 5.0–8.0)
PROTEIN: NEGATIVE mg/dL
Specific Gravity, Urine: 1.015 (ref 1.005–1.030)

## 2017-11-15 LAB — POCT PREGNANCY, URINE: Preg Test, Ur: NEGATIVE

## 2017-11-15 LAB — CBC
HCT: 38.7 % (ref 35.0–47.0)
HEMOGLOBIN: 13.3 g/dL (ref 12.0–16.0)
MCH: 28.7 pg (ref 26.0–34.0)
MCHC: 34.3 g/dL (ref 32.0–36.0)
MCV: 83.7 fL (ref 80.0–100.0)
PLATELETS: 186 10*3/uL (ref 150–440)
RBC: 4.63 MIL/uL (ref 3.80–5.20)
RDW: 15.1 % — ABNORMAL HIGH (ref 11.5–14.5)
WBC: 8.7 10*3/uL (ref 3.6–11.0)

## 2017-11-15 LAB — TROPONIN I: Troponin I: <0.03 ng/mL (ref <0.03)

## 2017-11-15 LAB — LIPASE, BLOOD: LIPASE: 28 U/L (ref 11–51)

## 2017-11-15 LAB — HCG, QUANTITATIVE, PREGNANCY: hCG, Beta Chain, Quant, S: <1 m[IU]/mL (ref <5)

## 2017-11-15 MED ORDER — LIDOCAINE VISCOUS HCL 2 % MT SOLN: 10.0000 mL | OROMUCOSAL | 0 refills | Status: AC | PRN

## 2017-11-15 MED ORDER — LIDOCAINE VISCOUS HCL 2 % MT SOLN
15.0000 mL | Freq: Once | OROMUCOSAL | Status: AC
  Administered 2017-11-15: 15 mL via OROMUCOSAL
  Filled 2017-11-15: qty 15

## 2017-11-15 MED ORDER — OXYCODONE-ACETAMINOPHEN 5-325 MG PO TABS
1.0000 | ORAL_TABLET | Freq: Once | ORAL | Status: AC
  Administered 2017-11-15: 1 via ORAL
  Filled 2017-11-15: qty 1

## 2017-11-15 MED ORDER — ONDANSETRON HCL 4 MG/2ML IJ SOLN
4.0000 mg | Freq: Once | INTRAMUSCULAR | Status: AC
  Administered 2017-11-15: 4 mg via INTRAVENOUS
  Filled 2017-11-15: qty 2

## 2017-11-15 MED ORDER — ONDANSETRON HCL 4 MG PO TABS: 4.0000 mg | ORAL_TABLET | Freq: Every day | ORAL | 0 refills | Status: AC | PRN

## 2017-11-15 MED ORDER — AMOXICILLIN 500 MG PO CAPS: 500.0000 mg | ORAL_CAPSULE | Freq: Three times a day (TID) | ORAL | 0 refills | Status: AC

## 2017-11-15 MED ORDER — CYCLOBENZAPRINE HCL 5 MG PO TABS: ORAL_TABLET | ORAL | 0 refills | Status: DC

## 2017-11-15 MED ORDER — SODIUM CHLORIDE 0.9 % IV BOLUS
1000.0000 mL | Freq: Once | INTRAVENOUS | Status: AC
  Administered 2017-11-15: 1000 mL via INTRAVENOUS

## 2017-11-15 MED ORDER — CYCLOBENZAPRINE HCL 5 MG PO TABS: ORAL_TABLET | ORAL | 0 refills | Status: AC

## 2017-11-15 MED ORDER — ONDANSETRON HCL 4 MG PO TABS: 4.0000 mg | ORAL_TABLET | Freq: Every day | ORAL | 0 refills | Status: DC | PRN

## 2017-11-15 NOTE — ED Provider Notes (Signed)
[facility] Emergency Department Provider Note  ____________________________________________  Time seen: Approximately 5:39 PM  I have reviewed the triage vital signs and the nursing notes.   HISTORY  Chief Complaint Back Pain    HPI Yvonne Shea is a 27 y.o. female presents emergency department for evaluation of upper left back and low back pain for 1 week and dental pain and vomiting for 1 day.  Patient's upper back has been hurting for the last week.  Pain does not radiate.  Patient states that her upper back hurts every time she moves her left shoulder.  She is also having some bilateral low back pain.  No injury.  She started vomiting last night.  She vomited twice last night and 4 times today.  She does not feel nauseous.  She has not been able to drink much today because she is concerned that she will vomit.  She is not having any abdominal or pelvic pain.  She says she may be pregnant.  Last menstrual period was last week.  She states that she had menstrual periods with her last 2 children and pregnancies did not show up on urine test.  She has been having difficulty sleeping for a couple of days.  No shortness of breath, chest pain, nausea, vomiting, abdominal pain, diarrhea.   Past Medical History:  Diagnosis Date  . Anxiety   . Depression   . Mental disorder   . PTSD (post-traumatic stress disorder)     Patient Active Problem List   Diagnosis Date Noted  . Post-operative state 10/13/2016  . Indication for care in labor and delivery, antepartum 10/08/2016  . Pregnancy 09/11/2016  . Preterm uterine contractions 08/09/2016  . Flank pain, acute 07/12/2016  . UTI (urinary tract infection) during pregnancy, second trimester 06/11/2016  . Panic disorder 06/11/2016  . 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 Uterine contractions during pregnancy 05/28/2016  . First trimester screening     Past Surgical History:  Procedure Laterality Date   . APPENDECTOMY    . CESAREAN SECTION    . CESAREAN SECTION N/A 10/13/2016   Procedure: CESAREAN SECTION;  Surgeon: Feliberto Gottron, Ihor Austin, MD;  Location: [facility] ORS;  Service: Obstetrics;  Laterality: N/A;  Female born @ 26 Weight: 8lbs 1oz Apagrs: 8/9  . CHOLECYSTECTOMY    . DILATION AND CURETTAGE OF UTERUS      Prior to Admission medications   Medication Sig Start Date End Date Taking? Authorizing Provider  amoxicillin (AMOXIL) 500 MG capsule Take 1 capsule (500 mg total) by mouth 3 (three) times daily. 11/15/17   Enid Derry, PA-C  clonazePAM (KLONOPIN) 0.5 MG disintegrating tablet Take 1 tablet (0.5 mg total) by mouth 2 (two) times daily as needed for seizure (anxiety). Patient not taking: Reported on 06/04/2017 10/15/16   Ward, Elenora Fender, MD  cyclobenzaprine (FLEXERIL) 5 MG tablet Take 1-2 tablets 3 times daily as needed 11/15/17   Enid Derry, PA-C  diphenhydrAMINE (BENADRYL) 25 MG tablet Take 2 tablets (50 mg total) at bedtime as needed by mouth for sleep. Patient not taking: Reported on 06/04/2017 02/06/17   Sharman Cheek, MD  escitalopram (LEXAPRO) 10 MG tablet Take 1 tablet (10 mg total) by mouth daily. Patient not taking: Reported on 06/04/2017 10/15/16 10/15/17  Ward, Elenora Fender, MD  hydrOXYzine (ATARAX/VISTARIL) 25 MG tablet Take 1 tablet (25 mg total) by mouth 3 (three) times daily as needed. Patient not taking: Reported on 06/04/2017 12/11/16   Little, Jordan Likes, PA-C  ibuprofen (ADVIL,MOTRIN) 600 MG tablet Take 1 tablet (600 mg total) by mouth every 6 (six) hours. Patient not taking: Reported on 06/04/2017 10/15/16   Ward, Elenora Fender, MD  lidocaine (XYLOCAINE) 2 % solution Use as directed 10 mLs in the mouth or throat as needed for up to 4 days for mouth pain. 11/15/17 11/19/17  Enid Derry, PA-C  naproxen (NAPROSYN) 500 MG tablet Take 1 tablet (500 mg total) by mouth 2 (two) times daily with a meal. 06/04/17   Jene Every, MD  ondansetron (ZOFRAN) 4 MG tablet Take 1 tablet (4  mg total) by mouth daily as needed for nausea or vomiting. 11/15/17 11/15/18  Enid Derry, PA-C  oxyCODONE-acetaminophen (PERCOCET/ROXICET) 5-325 MG tablet Take 1 tablet by mouth every 4 (four) hours as needed (pain scale 4-7). Patient not taking: Reported on 06/04/2017 10/15/16   Ward, Elenora Fender, MD    Allergies Sulfa antibiotics  No family history on file.  Social History Social History   Tobacco Use  . Smoking status: Current Every Day Smoker    Packs/day: 1.00    Types: Cigarettes  . Smokeless tobacco: Never Used  Substance Use Topics  . Alcohol use: No  . Drug use: No     Review of Systems  Constitutional: No fever/chills ENT: No upper respiratory complaints. Cardiovascular: No chest pain. Respiratory: No cough. No SOB. Gastrointestinal: No abdominal pain.  No nausea.  Genitourinary: Negative for dysuria. Musculoskeletal: Positive for back pain.  Skin: Negative for rash, abrasions, lacerations, ecchymosis. Neurological: Negative for headaches, numbness or tingling   ____________________________________________   PHYSICAL EXAM:  VITAL SIGNS: ED Triage Vitals  Enc Vitals Group     BP 11/15/17 1613 (!) 100/58     Pulse Rate 11/15/17 1613 93     Resp 11/15/17 1613 16     Temp 11/15/17 1613 98.9 F (37.2 C)     Temp Source 11/15/17 1613 Oral     SpO2 11/15/17 1613 99 %     Weight 11/15/17 1611 154 lb (69.9 kg)     Height 11/15/17 1611 5\' 4"  (1.626 m)     Head Circumference --      Peak Flow --      Pain Score 11/15/17 1611 8     Pain Loc --      Pain Edu? --      Excl. in GC? --      Constitutional: Alert and oriented. Well appearing and in no acute distress. Eyes: Conjunctivae are normal. PERRL. EOMI. Head: Atraumatic. ENT:      Ears:      Nose: No congestion/rhinnorhea.      Mouth/Throat: Mucous membranes are moist.  Tenderness to palpation to left lateral incisors.  No swelling.  No difficulty opening or closing mouth. Neck: No stridor.   Cardiovascular: Normal rate, regular rhythm.  Good peripheral circulation. Respiratory: Normal respiratory effort without tachypnea or retractions. Lungs CTAB. Good air entry to the bases with no decreased or absent breath sounds. Gastrointestinal: Bowel sounds 4 quadrants. Soft and nontender to palpation. No guarding or rigidity. No palpable masses. No distention. No CVA tenderness. Musculoskeletal: Full range of motion to all extremities. No gross deformities appreciated.  Tenderness to palpation to left scapula.  Pain elicited with range of motion of left shoulder.  Tenderness to palpation to bilateral lumbar muscles.  Normal gait.  Strength equal in upper and lower extremities bilaterally. Neurologic:  Normal speech and language. No gross focal neurologic deficits are appreciated.  Skin:  Skin is warm, dry and intact. No rash noted. Psychiatric: Mood and affect are normal. Speech and behavior are normal. Patient exhibits appropriate insight and judgement.   ____________________________________________   LABS (all labs ordered are listed, but only abnormal results are displayed)  Labs Reviewed  URINALYSIS, COMPLETE (UACMP) WITH MICROSCOPIC - Abnormal; Notable for the following components:      Result Value   Color, Urine YELLOW (*)    APPearance CLEAR (*)    All other components within normal limits  CBC - Abnormal; Notable for the following components:   RDW 15.1 (*)    All other components within normal limits  BASIC METABOLIC PANEL - Abnormal; Notable for the following components:   Glucose, Bld 102 (*)    Calcium 8.8 (*)    All other components within normal limits  LIPASE, BLOOD  HCG, QUANTITATIVE, PREGNANCY  TROPONIN I  POC URINE PREG, ED  POCT PREGNANCY, URINE   ____________________________________________  EKG  NSR ____________________________________________  RADIOLOGY Lexine BatonI, Genetta Fiero, personally viewed and evaluated these images (plain radiographs) as part of  my medical decision making, as well as reviewing the written report by the radiologist.  Dg Chest 2 View  Result Date: 11/15/2017 CLINICAL DATA:  27 y/o  F; upper back pain and vomiting. EXAM: CHEST - 2 VIEW COMPARISON:  09/14/2017 chest radiograph FINDINGS: Stable heart size and mediastinal contours are within normal limits. Both lungs are clear. The visualized skeletal structures are unremarkable. Right upper quadrant cholecystectomy clips. IMPRESSION: No acute pulmonary process.  Stable chest radiograph. Electronically Signed   By: Mitzi HansenLance  Furusawa-Stratton M.D.   On: 11/15/2017 18:20    ____________________________________________    PROCEDURES  Procedure(s) performed:    Procedures    Medications  sodium chloride 0.9 % bolus 1,000 mL (0 mLs Intravenous Stopped 11/15/17 1920)  ondansetron (ZOFRAN) injection 4 mg (4 mg Intravenous Given 11/15/17 1819)  lidocaine (XYLOCAINE) 2 % viscous mouth solution 15 mL (15 mLs Mouth/Throat Given 11/15/17 1935)  oxyCODONE-acetaminophen (PERCOCET/ROXICET) 5-325 MG per tablet 1 tablet (1 tablet Oral Given 11/15/17 2044)     ____________________________________________   INITIAL IMPRESSION / ASSESSMENT AND PLAN / ED COURSE  Pertinent labs & imaging results that were available during my care of the patient were reviewed by me and considered in my medical decision making (see chart for details).  Review of the [place] CSRS was performed in accordance of the [facility] prior to dispensing any controlled drugs.    Patient presents emergency department for evaluation of multiple complaints.  Symptoms are consistent with musculoskeletal pain and dental infection.  Vital signs and exam are reassuring.  Chest x-ray is negative for acute processes.  EKG shows normal sinus rhythm.  Blood work is largely unremarkable.  No infection on urinalysis.  Pregnancy test is negative.  Mouth pain resolved with viscous lidocaine.  Patient states that she has vomited several  times today, but she has not had any vomiting while in the ED.  She is in the room drinking soda and eating Cheetos.  She appears well. Patient will be discharged home with prescriptions for viscous lidocaine, amoxicillin, Flexeril, Zofran. Patient is to follow up with PCP as directed. Patient is given ED precautions to return to the ED for any worsening or new symptoms.     ____________________________________________  FINAL CLINICAL IMPRESSION(S) / ED DIAGNOSES  Final diagnoses:  Dental infection  Acute left-sided back pain, unspecified back location      NEW MEDICATIONS STARTED DURING THIS VISIT:  ED Discharge Orders         Ordered    lidocaine (XYLOCAINE) 2 % solution  As needed     11/15/17 2028    amoxicillin (AMOXIL) 500 MG capsule  3 times daily     11/15/17 2028    cyclobenzaprine (FLEXERIL) 5 MG tablet  Status:  Discontinued     11/15/17 2028    ondansetron (ZOFRAN) 4 MG tablet  Daily PRN,   Status:  Discontinued     11/15/17 2028    cyclobenzaprine (FLEXERIL) 5 MG tablet     11/15/17 2030    ondansetron (ZOFRAN) 4 MG tablet  Daily PRN     11/15/17 2030              This chart was dictated using voice recognition software/Dragon. Despite best efforts to proofread, errors can occur which can change the meaning. Any change was purely unintentional.    Enid Derry, PA-C 11/15/17 2339    Dionne Bucy, MD 11/15/17 [phone]

## 2017-11-15 NOTE — ED Triage Notes (Addendum)
Pt c/o lower back pain that started one week ago - pt denies injury - c/o left shoulder blade pain with movement

## 2017-11-15 NOTE — Discharge Instructions (Addendum)
OPTIONS FOR DENTAL FOLLOW UP CARE ° °[place] Department of Health and Human Services - Local Safety Net Dental Clinics °http://www.ncdhhs.gov/dph/oralhealth/services/safetynetclinics.htm °  °[facility] (336-562-3123) ° °[facility] [place] ([phone]) ° °[facility] ([phone] ext [phone]) ° °[facility] ([phone]) ° °[facility] ([phone]) °This clinic caters to the indigent population and is on a lottery system. °Location: °[facility], [address], [place] °Clinic Hours: °Wednesdays from 6pm - 9pm, patients seen by a lottery system. °For dates, call or go to [email or web address]/[facility]/patients/[email or web address] °Services: °Cleanings, fillings and simple extractions. °Payment Options: °DENTAL WORK IS FREE OF CHARGE. Bring proof of income or support. °Best way to get seen: °Arrive at 5:15 pm - this is a lottery, NOT first come/first serve, so arriving earlier will not increase your chances of being seen. °  °  °[facility] °[phone] °Select option 1 for emergencies °  °Location: °[facility], [address], [place] °Clinic Hours: °No walk-ins accepted - call the day before to schedule an appointment. °Check in times are 9:30 am and 1:30 pm. °Services: °Simple extractions, temporary fillings, pulpectomy/pulp debridement, uncomplicated abscess drainage. °Payment Options: °PAYMENT IS DUE AT THE TIME OF SERVICE.  Fee is usually $100-200, additional surgical procedures (e.g. abscess drainage) may be extra. °Cash, checks, Visa/MasterCard accepted.  Can file Medicaid if patient is covered for dental - patient should call case worker to check. °No discount for [facility] patients. °Best way to get seen: °MUST call the day before and get onto the schedule. Can usually be seen the next 1-2 days. No walk-ins accepted. °  °  °[facility] °[phone] °   °Location: °[facility], [address], [place] °Clinic Hours: °M, W, Th, F 8am or 1:30pm, Tues 9a or 1:30 - first come/first served. °Services: °Simple extractions, temporary fillings, uncomplicated abscess drainage.  You do not need to be an [place] resident. °Payment Options: °PAYMENT IS DUE AT THE TIME OF SERVICE. °Dental insurance, otherwise sliding scale - bring proof of income or support. °Depending on income and treatment needed, cost is usually $50-200. °Best way to get seen: °Arrive early as it is first come/first served. °  °  °[facility] °[phone] °  °Location: °[address] °Clinic Hours: °Mon-Thu 8a-5p °Services: °Most basic dental services including extractions and fillings. °Payment Options: °PAYMENT IS DUE AT THE TIME OF SERVICE. °Sliding scale, up to 50% off - bring proof if income or support. °Medicaid with dental option accepted. °Best way to get seen: °Call to schedule an appointment, can usually be seen within 2 weeks OR they will try to see walk-ins - show up at 8a or 2p (you may have to wait). °  °  °[facility] °[phone] °[place] RESIDENTS ONLY °  °Location: °[facility], [address], [place], [place] [address] °Clinic Hours: By appointment only. °Monday - Thursday 8am-5pm, Friday 8am-12pm °Services: Cleanings, fillings, extractions. °Payment Options: °PAYMENT IS DUE AT THE TIME OF SERVICE. °Cash, Visa or MasterCard. Sliding scale - $30 minimum per service. °Best way to get seen: °Come in to office, complete packet and make an appointment - need proof of income °or support monies for each household member and proof of [place] residence. °Usually takes about a month to get in. °  °  °[facility] °[phone] °  °Location: °[address]   [place] °Clinic Hours: Walk-in Urgent Care Dental Services are offered Monday-Friday  mornings only. °The numbers of emergencies accepted daily is limited to the number of °providers available. °Maximum 15 - Mondays, Wednesdays & Thursdays °Maximum 10 - Tuesdays & Fridays °Services: °You do not need to be a [place] resident to be seen for a dental emergency. °Emergencies are defined as pain, swelling, abnormal bleeding, or dental trauma. Walkins will receive x-rays if needed. °NOTE: Dental cleaning is not an emergency. °Payment Options: °PAYMENT IS DUE AT THE TIME OF SERVICE. °Minimum co-pay is $40.00 for uninsured patients. °Minimum co-pay is $3.00 for Medicaid with dental coverage. °Dental Insurance is accepted and must be presented at time of visit. °Medicare does not cover dental. °Forms of payment: Cash, credit card, checks. °Best way to get seen: °If not previously registered with the clinic, walk-in dental registration begins at 7:15 am and is on a first come/first serve basis. °If previously registered with the clinic, call to make an appointment. °  °  °[facility] °[phone] °[place] RESIDENTS ONLY °  °Location: °[address], [place], [place] °Clinic Hours: °Mon-Thu 10a-2p °Services: Extractions only! °Payment Options: °FREE (donations accepted) - bring proof of income or support °Best way to get seen: °Call and schedule an appointment OR come at 8am on the 1st Monday of every month (except for holidays) when it is first come/first served. °  °  °[facility] °[phone] °  °Location: °[address], [place] °Clinic Hours: °Friday mornings °Services, Payment Options, Best way to get seen: °Call for info °

## 2017-11-15 NOTE — ED Notes (Signed)
Pt c/o lower back pain, breast pain, and not being able to sleep. Also states she is having mouth pain on Left lower side of mouth at this time. Patient reports her last two pregnancies would not show up in urine tests but had to have a blood test done. Patient ambulatory with no problems.

## 2018-02-24 ENCOUNTER — Other Ambulatory Visit: Payer: Self-pay

## 2018-02-24 ENCOUNTER — Encounter: Payer: Self-pay | Admitting: Emergency Medicine

## 2018-02-24 ENCOUNTER — Emergency Department
Admission: EM | Admit: 2018-02-24 | Discharge: 2018-02-24 | Disposition: A | Discharge status: Home / Self Care | Payer: Self-pay | Attending: Emergency Medicine | Admitting: Emergency Medicine

## 2018-02-24 ENCOUNTER — Emergency Department: Payer: Self-pay

## 2018-02-24 DIAGNOSIS — N938 Other specified abnormal uterine and vaginal bleeding: Secondary | ICD-10-CM | POA: Insufficient documentation

## 2018-02-24 DIAGNOSIS — Z79899 Other long term (current) drug therapy: Secondary | ICD-10-CM | POA: Insufficient documentation

## 2018-02-24 DIAGNOSIS — N390 Urinary tract infection, site not specified: Secondary | ICD-10-CM | POA: Insufficient documentation

## 2018-02-24 DIAGNOSIS — F17210 Nicotine dependence, cigarettes, uncomplicated: Secondary | ICD-10-CM | POA: Insufficient documentation

## 2018-02-24 DIAGNOSIS — N939 Abnormal uterine and vaginal bleeding, unspecified: Secondary | ICD-10-CM

## 2018-02-24 LAB — COMPREHENSIVE METABOLIC PANEL
ALBUMIN: 4.1 g/dL (ref 3.5–5.0)
ALK PHOS: 68 U/L (ref 38–126)
ALT: 17 U/L (ref 0–44)
AST: 17 U/L (ref 15–41)
Anion gap: 9 (ref 5–15)
BILIRUBIN TOTAL: 0.4 mg/dL (ref 0.3–1.2)
BUN: 15 mg/dL (ref 6–20)
CALCIUM: 9.3 mg/dL (ref 8.9–10.3)
CO2: 25 mmol/L (ref 22–32)
Chloride: 106 mmol/L (ref 98–111)
Creatinine, Ser: 0.59 mg/dL (ref 0.44–1.00)
GFR calc Af Amer: >60 mL/min (ref >60)
GFR calc non Af Amer: >60 mL/min (ref >60)
GLUCOSE: 87 mg/dL (ref 70–99)
POTASSIUM: 3.9 mmol/L (ref 3.5–5.1)
SODIUM: 140 mmol/L (ref 135–145)
TOTAL PROTEIN: 7.6 g/dL (ref 6.5–8.1)

## 2018-02-24 LAB — URINALYSIS, COMPLETE (UACMP) WITH MICROSCOPIC
Bilirubin Urine: NEGATIVE
GLUCOSE, UA: NEGATIVE mg/dL
KETONES UR: NEGATIVE mg/dL
Leukocytes, UA: NEGATIVE
Nitrite: POSITIVE — AB
PROTEIN: 30 mg/dL — AB
Specific Gravity, Urine: 1.021 (ref 1.005–1.030)
pH: 6 (ref 5.0–8.0)

## 2018-02-24 LAB — CBC
HCT: 42.8 % (ref 36.0–46.0)
HEMOGLOBIN: 13.6 g/dL (ref 12.0–15.0)
MCH: 28.3 pg (ref 26.0–34.0)
MCHC: 31.8 g/dL (ref 30.0–36.0)
MCV: 89 fL (ref 80.0–100.0)
Platelets: 237 10*3/uL (ref 150–400)
RBC: 4.81 MIL/uL (ref 3.87–5.11)
RDW: 14 % (ref 11.5–15.5)
WBC: 7.9 10*3/uL (ref 4.0–10.5)
nRBC: 0.3 % — ABNORMAL HIGH (ref 0.0–0.2)

## 2018-02-24 LAB — LIPASE, BLOOD: Lipase: 54 U/L — ABNORMAL HIGH (ref 11–51)

## 2018-02-24 LAB — POCT PREGNANCY, URINE: PREG TEST UR: NEGATIVE

## 2018-02-24 LAB — HCG, QUANTITATIVE, PREGNANCY: hCG, Beta Chain, Quant, S: 1 m[IU]/mL (ref <5)

## 2018-02-24 MED ORDER — CEPHALEXIN 500 MG PO CAPS: 500.0000 mg | ORAL_CAPSULE | Freq: Three times a day (TID) | ORAL | 0 refills | Status: DC

## 2018-02-24 NOTE — ED Triage Notes (Signed)
Lower abd pain

## 2018-02-24 NOTE — ED Triage Notes (Addendum)
Vaginal bleeding x1 day ,  increased use of Pads.

## 2018-02-24 NOTE — ED Provider Notes (Signed)
[facility] Emergency Department Provider Note  Time seen: 8:37 AM  I have reviewed the triage vital signs and the nursing notes.   HISTORY  Chief Complaint Vaginal Bleeding    HPI Yvonne Shea is a 27 y.o. 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  According to the patient she had 2 irregular periods last month, then started having bleeding with lower abdominal cramping last night.  States the bleeding has been more heavy than a normal period, going through a pad in approximately 4 hours.  Denies any weakness or lightheadedness.  Patient states in the past she has had miscarriages and is concerned that she could be having a miscarriage.  States a urine pregnancy test was negative but states her prior pregnancies of only showed up on the blood test per patient.  Patient states the only reason she is here is to make sure she is not having a miscarriage.   Past Medical History:  Diagnosis Date  . Anxiety   . Depression   . Mental disorder   . PTSD (post-traumatic stress disorder)     Patient Active Problem List   Diagnosis Date Noted  . Post-operative state 10/13/2016  . Indication for care in labor and delivery, antepartum 10/08/2016  . Pregnancy 09/11/2016  . Preterm uterine contractions 08/09/2016  . Flank pain, acute 07/12/2016  . UTI (urinary tract infection) during pregnancy, second trimester 06/11/2016  . Panic disorder 06/11/2016  . 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 Uterine contractions during pregnancy 05/28/2016  . First trimester screening     Past Surgical History:  Procedure Laterality Date  . APPENDECTOMY    . CESAREAN SECTION    . CESAREAN SECTION N/A 10/13/2016   Procedure: CESAREAN SECTION;  Surgeon: Feliberto Gottron, Ihor Austin, MD;  Location: [facility] ORS;  Service: Obstetrics;  Laterality: N/A;  Female born @ 21 Weight:  8lbs 1oz Apagrs: 8/9  . CHOLECYSTECTOMY    . DILATION AND CURETTAGE OF UTERUS      Prior to Admission medications   Medication Sig Start Date End Date Taking? Authorizing Provider  amoxicillin (AMOXIL) 500 MG capsule Take 1 capsule (500 mg total) by mouth 3 (three) times daily. 11/15/17   Enid Derry, PA-C  clonazePAM (KLONOPIN) 0.5 MG disintegrating tablet Take 1 tablet (0.5 mg total) by mouth 2 (two) times daily as needed for seizure (anxiety). Patient not taking: Reported on 06/04/2017 10/15/16   Ward, Elenora Fender, MD  cyclobenzaprine (FLEXERIL) 5 MG tablet Take 1-2 tablets 3 times daily as needed 11/15/17   Enid Derry, PA-C  diphenhydrAMINE (BENADRYL) 25 MG tablet Take 2 tablets (50 mg total) at bedtime as needed by mouth for sleep. Patient not taking: Reported on 06/04/2017 02/06/17   Sharman Cheek, MD  escitalopram (LEXAPRO) 10 MG tablet Take 1 tablet (10 mg total) by mouth daily. Patient not taking: Reported on 06/04/2017 10/15/16 10/15/17  Ward, Elenora Fender, MD  hydrOXYzine (ATARAX/VISTARIL) 25 MG tablet Take 1 tablet (25 mg total) by mouth 3 (three) times daily as needed. Patient not taking: Reported on 06/04/2017 12/11/16   Little, Traci M, PA-C  ibuprofen (ADVIL,MOTRIN) 600 MG tablet Take 1 tablet (600 mg total) by mouth every 6 (six) hours. Patient not taking: Reported on 06/04/2017 10/15/16   Ward, Elenora Fender, MD  naproxen (NAPROSYN) 500 MG tablet Take 1 tablet (500 mg total) by mouth 2 (two) times daily with a  meal. 06/04/17   Jene EveryKinner, Robert, MD  ondansetron [facility](ZOFRAN) 4 MG tablet Take 1 tablet (4 mg total) by mouth daily as needed for nausea or vomiting. 11/15/17 11/15/18  Enid DerryWagner, Ashley, PA-C  oxyCODONE-acetaminophen (PERCOCET/ROXICET) 5-325 MG tablet Take 1 tablet by mouth every 4 (four) hours as needed (pain scale 4-7). Patient not taking: Reported on 06/04/2017 10/15/16   Ward, Elenora Fenderhelsea C, MD    Allergies  Allergen Reactions  . Sulfa Antibiotics Nausea Only and Nausea And Vomiting     No family history on file.  Social History Social History   Tobacco Use  . Smoking status: Current Every Day Smoker    Packs/day: 1.00    Types: Cigarettes  . Smokeless tobacco: Never Used  Substance Use Topics  . Alcohol use: No  . Drug use: No    Review of Systems Constitutional: Negative for fever. Cardiovascular: Negative for chest pain. Respiratory: Negative for shortness of breath. Gastrointestinal: Mild lower abdominal cramping Genitourinary: Negative for urinary compaints.  Positive for vaginal bleeding since last night Musculoskeletal: Negative for musculoskeletal complaints Skin: Negative for skin complaints  Neurological: Negative for headache All other ROS negative  ____________________________________________   PHYSICAL EXAM:  VITAL SIGNS: ED Triage Vitals  Enc Vitals Group     BP 02/24/18 0805 (!) 124/94     Pulse Rate 02/24/18 0805 86     Resp 02/24/18 0805 16     Temp 02/24/18 0805 98.1 F (36.7 C)     Temp Source 02/24/18 0805 Oral     SpO2 02/24/18 0805 99 %     Weight 02/24/18 0805 164 lb (74.4 kg)     Height 02/24/18 0805 5\' 4"  (1.626 m)     Head Circumference --      Peak Flow --      Pain Score 02/24/18 0804 10     Pain Loc --      Pain Edu? --      Excl. in GC? --     Constitutional: Alert and oriented. Well appearing and in no distress. Eyes: Normal exam ENT   Head: Normocephalic and atraumatic.   Mouth/Throat: Mucous membranes are moist. Cardiovascular: Normal rate, regular rhythm.  Respiratory: Normal respiratory effort without tachypnea nor retractions. Breath sounds are clear  Gastrointestinal: Soft, slight lower abdominal tenderness to palpation.  No rebound guarding or distention. Musculoskeletal: Nontender with normal range of motion in all extremities.  Neurologic:  Normal speech and language. No gross focal neurologic deficits Skin:  Skin is warm, dry and intact.  Psychiatric: Mood and affect are normal. Speech  and behavior are normal.     RADIOLOGY  Ultrasound normal  ____________________________________________   INITIAL IMPRESSION / ASSESSMENT AND PLAN / ED COURSE  Pertinent labs & imaging results that were available during my care of the patient were reviewed by me and considered in my medical decision making (see chart for details).  Patient presents emergency department for vaginal bleeding, concerned over possible miscarriage.  Patient states he has had 4 miscarriages in the past, states she had 2 irregular periods last month which is abnormal for her now again with bleeding, and was concerned she could be having a miscarriage.  States lower abdominal cramping consistent with menstrual cramping.  Patient's urine pregnancy test is negative however she insists that her past pregnancies have only shown up on the blood pregnancy test.  I have added on a beta hCG.  We will check labs, obtain an ultrasound to further evaluate.  Patient  agreeable to plan.  Reassuringly patient has very minimal tenderness across her lower abdomen, vitals are reassuring.  Ultrasound is normal.  Lab work is normal including H&H.  Beta-hCG quantitative is negative.  We will discharge from the emergency department with PCP follow-up.  Urinalysis does show what appears to be a urinary tract infection we will cover with Keflex and send a urine culture.  ____________________________________________   FINAL CLINICAL IMPRESSION(S) / ED DIAGNOSES  Vaginal bleeding UTI   Minna Antis, MD 02/24/18 1035

## 2018-02-26 LAB — URINE CULTURE: Culture: >=100000 — AB

## 2018-03-17 ENCOUNTER — Other Ambulatory Visit: Payer: Self-pay

## 2018-03-17 ENCOUNTER — Emergency Department
Admission: EM | Admit: 2018-03-17 | Discharge: 2018-03-17 | Disposition: A | Discharge status: Home / Self Care | Payer: Self-pay | Attending: Emergency Medicine | Admitting: Emergency Medicine

## 2018-03-17 DIAGNOSIS — R070 Pain in throat: Secondary | ICD-10-CM | POA: Insufficient documentation

## 2018-03-17 DIAGNOSIS — R05 Cough: Secondary | ICD-10-CM | POA: Insufficient documentation

## 2018-03-17 DIAGNOSIS — F17210 Nicotine dependence, cigarettes, uncomplicated: Secondary | ICD-10-CM | POA: Insufficient documentation

## 2018-03-17 DIAGNOSIS — R350 Frequency of micturition: Secondary | ICD-10-CM | POA: Insufficient documentation

## 2018-03-17 DIAGNOSIS — F419 Anxiety disorder, unspecified: Secondary | ICD-10-CM | POA: Insufficient documentation

## 2018-03-17 DIAGNOSIS — B349 Viral infection, unspecified: Secondary | ICD-10-CM | POA: Insufficient documentation

## 2018-03-17 LAB — URINALYSIS, ROUTINE W REFLEX MICROSCOPIC
Bilirubin Urine: NEGATIVE
Glucose, UA: NEGATIVE mg/dL
HGB URINE DIPSTICK: NEGATIVE
Ketones, ur: NEGATIVE mg/dL
LEUKOCYTES UA: NEGATIVE
NITRITE: NEGATIVE
PH: 5 (ref 5.0–8.0)
Protein, ur: 30 mg/dL — AB
SPECIFIC GRAVITY, URINE: 1.032 — AB (ref 1.005–1.030)

## 2018-03-17 MED ORDER — ONDANSETRON 4 MG PO TBDP: 4.0000 mg | ORAL_TABLET | Freq: Three times a day (TID) | ORAL | 0 refills | Status: AC | PRN

## 2018-03-17 MED ORDER — HYDROXYZINE HCL 25 MG PO TABS: 25.0000 mg | ORAL_TABLET | Freq: Three times a day (TID) | ORAL | 0 refills | Status: AC | PRN

## 2018-03-17 NOTE — ED Provider Notes (Signed)
[facility] Emergency Department Provider Note  ____________________________________________  Time seen: Approximately 10:28 PM  I have reviewed the triage vital signs and the nursing notes.   HISTORY  Chief Complaint Emesis and Diarrhea   HPI Yvonne Shea is a 27 y.o. female who presents to the emergency department for treatment and evaluation of feeling "sick" for about a week.  She has had body aches, sore throat, cough, nasal congestion that she has been treating with over-the-counter cold medications.  Also, the patient states that she is urinating more often than usual and recently had a urinary tract infection but did not finish the antibiotics.  She denies dysuria or flank pain.  She denies fever, but states that she has been very tired and has had no energy.    Past Medical History:  Diagnosis Date  . Anxiety   . Depression   . Mental disorder   . PTSD (post-traumatic stress disorder)     Patient Active Problem List   Diagnosis Date Noted  . Post-operative state 10/13/2016  . Indication for care in labor and delivery, antepartum 10/08/2016  . Pregnancy 09/11/2016  . Preterm uterine contractions 08/09/2016  . Flank pain, acute 07/12/2016  . UTI (urinary tract infection) during pregnancy, second trimester 06/11/2016  . Panic disorder 06/11/2016  . 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 Uterine contractions during pregnancy 05/28/2016  . First trimester screening     Past Surgical History:  Procedure Laterality Date  . APPENDECTOMY    . CESAREAN SECTION    . CESAREAN SECTION N/A 10/13/2016   Procedure: CESAREAN SECTION;  Surgeon: Feliberto Gottron, Ihor Austin, MD;  Location: [facility] ORS;  Service: Obstetrics;  Laterality: N/A;  Female born @ 54 Weight: 8lbs 1oz Apagrs: 8/9  . CHOLECYSTECTOMY    . DILATION AND CURETTAGE OF UTERUS      Prior to Admission medications   Medication Sig Start Date End Date Taking? Authorizing Provider   amoxicillin (AMOXIL) 500 MG capsule Take 1 capsule (500 mg total) by mouth 3 (three) times daily. 11/15/17   Enid Derry, PA-C  cephALEXin (KEFLEX) 500 MG capsule Take 1 capsule (500 mg total) by mouth 3 (three) times daily. 02/24/18   Minna Antis, MD  clonazePAM (KLONOPIN) 0.5 MG disintegrating tablet Take 1 tablet (0.5 mg total) by mouth 2 (two) times daily as needed for seizure (anxiety). Patient not taking: Reported on 06/04/2017 10/15/16   Ward, Elenora Fender, MD  cyclobenzaprine (FLEXERIL) 5 MG tablet Take 1-2 tablets 3 times daily as needed 11/15/17   Enid Derry, PA-C  diphenhydrAMINE (BENADRYL) 25 MG tablet Take 2 tablets (50 mg total) at bedtime as needed by mouth for sleep. Patient not taking: Reported on 06/04/2017 02/06/17   Sharman Cheek, MD  escitalopram (LEXAPRO) 10 MG tablet Take 1 tablet (10 mg total) by mouth daily. Patient not taking: Reported on 06/04/2017 10/15/16 10/15/17  Ward, Elenora Fender, MD  hydrOXYzine (ATARAX/VISTARIL) 25 MG tablet Take 1 tablet (25 mg total) by mouth 3 (three) times daily as needed. 03/17/18   Bruce Churilla, Rulon Eisenmenger B, FNP  ibuprofen (ADVIL,MOTRIN) 600 MG tablet Take 1 tablet (600 mg total) by mouth every 6 (six) hours. Patient not taking: Reported on 06/04/2017 10/15/16   Ward, Elenora Fender, MD  naproxen (NAPROSYN) 500 MG tablet Take 1 tablet (500 mg total) by mouth 2 (two) times daily with a meal. 06/04/17   Jene Every, MD  ondansetron (ZOFRAN) 4 MG tablet Take 1 tablet (4 mg total) by mouth daily  as needed for nausea or vomiting. 11/15/17 11/15/18  Enid DerryWagner, Ashley, PA-C  ondansetron (ZOFRAN-ODT) 4 MG disintegrating tablet Take 1 tablet (4 mg total) by mouth every 8 (eight) hours as needed for nausea or vomiting. 03/17/18   Kasra Melvin B, FNP  oxyCODONE-acetaminophen (PERCOCET/ROXICET) 5-325 MG tablet Take 1 tablet by mouth every 4 (four) hours as needed (pain scale 4-7). Patient not taking: Reported on 06/04/2017 10/15/16   Ward, Elenora Fenderhelsea C, MD     Allergies Sulfa antibiotics  No family history on file.  Social History Social History   Tobacco Use  . Smoking status: Current Every Day Smoker    Packs/day: 1.00    Types: Cigarettes  . Smokeless tobacco: Never Used  Substance Use Topics  . Alcohol use: No  . Drug use: No    Review of Systems Constitutional: Negative for fever/chills.  Decreased appetite. ENT: Positive for sore throat. Cardiovascular: Denies chest pain. Respiratory: Negative for shortness of breath.  Positive for cough.  Negative for wheezing.  Gastrointestinal: Positive for nausea, no vomiting.  Negative for diarrhea.  Musculoskeletal: Positive for body aches Skin: Negative for rash. Neurological: Positive for headaches ____________________________________________   PHYSICAL EXAM:  VITAL SIGNS: ED Triage Vitals  Enc Vitals Group     BP 03/17/18 2030 (!) 108/52     Pulse Rate 03/17/18 2030 82     Resp 03/17/18 2030 16     Temp 03/17/18 2030 98.2 F (36.8 C)     Temp Source 03/17/18 2030 Oral     SpO2 03/17/18 2030 100 %     Weight 03/17/18 2031 160 lb (72.6 kg)     Height 03/17/18 2031 5\' 2"  (1.575 m)     Head Circumference --      Peak Flow --      Pain Score 03/17/18 2030 8     Pain Loc --      Pain Edu? --      Excl. in GC? --     Constitutional: Alert and oriented.  Well appearing and in no acute distress. Eyes: Conjunctivae are normal. Ears: Bilateral tympanic membranes are injected Nose: Maxillary sinus congestion noted; no rhinnorhea. Mouth/Throat: Mucous membranes are moist.  Oropharynx erythematous. Tonsils not visualized. Uvula midline. Neck: No stridor.  Lymphatic: No cervical lymphadenopathy. Cardiovascular: Normal rate, regular rhythm. Good peripheral circulation. Respiratory: Respirations are even and unlabored.  No retractions.  Breath sounds clear to auscultation throughout. Gastrointestinal: Soft and nontender.  Musculoskeletal: FROM x 4 extremities.  Neurologic:   Normal speech and language. Skin:  Skin is warm, dry and intact. No rash noted. Psychiatric: Mood and affect are normal. Speech and behavior are normal.  ____________________________________________   LABS (all labs ordered are listed, but only abnormal results are displayed)  Labs Reviewed  URINALYSIS, ROUTINE W REFLEX MICROSCOPIC - Abnormal; Notable for the following components:      Result Value   Color, Urine YELLOW (*)    APPearance HAZY (*)    Specific Gravity, Urine 1.032 (*)    Protein, ur 30 (*)    Bacteria, UA RARE (*)    All other components within normal limits  URINE CULTURE   ____________________________________________  EKG  Not indicated ____________________________________________  RADIOLOGY  Not indicated ____________________________________________   PROCEDURES  Procedure(s) performed: None  Critical Care performed: No ____________________________________________   INITIAL IMPRESSION / ASSESSMENT AND PLAN / ED COURSE  27 y.o. female presenting to the emergency department for treatment and evaluation of symptoms most consistent with a  viral syndrome.  She is a week out and therefore influenza testing would not be accurate at this time.  Patient states that the cough, sore throat, and body aches has improved but she is still extremely tired and nasal congestion is not much better.  Also, the patient does not appear to currently have a urinary tract infection but the specimen was sent for culture.  During exam, patient complains of more frequent anxiety secondary to the death of a loved one.  For that, she will be treated with Vistaril.  For the nausea she will be treated with Zofran.    Medications - No data to display  ED Discharge Orders         Ordered    hydrOXYzine (ATARAX/VISTARIL) 25 MG tablet  3 times daily PRN     03/17/18 2206    ondansetron (ZOFRAN-ODT) 4 MG disintegrating tablet  Every 8 hours PRN     03/17/18 2206            Pertinent labs & imaging results that were available during my care of the patient were reviewed by me and considered in my medical decision making (see chart for details).    If controlled substance prescribed during this visit, 12 month history viewed on the [place] prior to issuing an initial prescription for Schedule II or III opiod. ____________________________________________   FINAL CLINICAL IMPRESSION(S) / ED DIAGNOSES  Final diagnoses:  Acute viral syndrome  Anxiety disorder, unspecified type    Note:  This document was prepared using Dragon voice recognition software and may include unintentional dictation errors.     Chinita Pesterriplett, Shaunessy Dobratz B, FNP 03/17/18 2235    Emily FilbertWilliams, Jonathan E, MD 03/17/18 [phone]

## 2018-03-17 NOTE — ED Triage Notes (Signed)
Pt states she has been "sick" for almost a week. Pt states she began to have body aches, sore throat, cough earlier this week. Pt states she now has vomiting that began two days ago. Pt initially stated she has had diarrhea, but then clarifies and states 'i'm just using the bathroom a lot".

## 2018-03-17 NOTE — Discharge Instructions (Signed)
There is no urinary tract infection on the rapid test, however I have sent it for a urine culture.  If you need to be on antibiotics, you will be contacted by 1 of the nursing staff in a couple of days.  Please call and schedule an appointment with primary care.  You may call [facility] to see if they have any open appointments.  Return to the emergency department for symptoms that change or worsen if you are unable to see primary care.

## 2018-03-19 LAB — URINE CULTURE: Culture: NO GROWTH

## 2018-05-08 ENCOUNTER — Emergency Department
Admission: EM | Admit: 2018-05-08 | Discharge: 2018-05-08 | Disposition: A | Discharge status: Home / Self Care | Payer: Self-pay | Attending: Emergency Medicine | Admitting: Emergency Medicine

## 2018-05-08 ENCOUNTER — Encounter: Payer: Self-pay | Admitting: Emergency Medicine

## 2018-05-08 ENCOUNTER — Other Ambulatory Visit: Payer: Self-pay

## 2018-05-08 ENCOUNTER — Emergency Department: Payer: Self-pay

## 2018-05-08 DIAGNOSIS — F17210 Nicotine dependence, cigarettes, uncomplicated: Secondary | ICD-10-CM | POA: Insufficient documentation

## 2018-05-08 DIAGNOSIS — Z79899 Other long term (current) drug therapy: Secondary | ICD-10-CM | POA: Insufficient documentation

## 2018-05-08 DIAGNOSIS — R0789 Other chest pain: Secondary | ICD-10-CM | POA: Insufficient documentation

## 2018-05-08 DIAGNOSIS — N390 Urinary tract infection, site not specified: Secondary | ICD-10-CM | POA: Insufficient documentation

## 2018-05-08 LAB — COMPREHENSIVE METABOLIC PANEL
ALT: 19 U/L (ref 0–44)
AST: 19 U/L (ref 15–41)
Albumin: 4.5 g/dL (ref 3.5–5.0)
Alkaline Phosphatase: 63 U/L (ref 38–126)
Anion gap: 6 (ref 5–15)
BUN: 12 mg/dL (ref 6–20)
CO2: 25 mmol/L (ref 22–32)
CREATININE: 0.57 mg/dL (ref 0.44–1.00)
Calcium: 9.2 mg/dL (ref 8.9–10.3)
Chloride: 107 mmol/L (ref 98–111)
GFR calc Af Amer: >60 mL/min (ref >60)
GFR calc non Af Amer: >60 mL/min (ref >60)
Glucose, Bld: 81 mg/dL (ref 70–99)
Potassium: 3.9 mmol/L (ref 3.5–5.1)
Sodium: 138 mmol/L (ref 135–145)
Total Bilirubin: 0.3 mg/dL (ref 0.3–1.2)
Total Protein: 7.6 g/dL (ref 6.5–8.1)

## 2018-05-08 LAB — URINALYSIS, COMPLETE (UACMP) WITH MICROSCOPIC
Bilirubin Urine: NEGATIVE
Glucose, UA: NEGATIVE mg/dL
Hgb urine dipstick: NEGATIVE
Ketones, ur: NEGATIVE mg/dL
Leukocytes,Ua: NEGATIVE
Nitrite: POSITIVE — AB
PH: 6 (ref 5.0–8.0)
Protein, ur: NEGATIVE mg/dL
Specific Gravity, Urine: 1.021 (ref 1.005–1.030)

## 2018-05-08 LAB — CBC
HCT: 40.8 % (ref 36.0–46.0)
Hemoglobin: 13.4 g/dL (ref 12.0–15.0)
MCH: 28.5 pg (ref 26.0–34.0)
MCHC: 32.8 g/dL (ref 30.0–36.0)
MCV: 86.6 fL (ref 80.0–100.0)
PLATELETS: 225 10*3/uL (ref 150–400)
RBC: 4.71 MIL/uL (ref 3.87–5.11)
RDW: 13.8 % (ref 11.5–15.5)
WBC: 9.7 10*3/uL (ref 4.0–10.5)
nRBC: 0 % (ref 0.0–0.2)

## 2018-05-08 LAB — POCT PREGNANCY, URINE: Preg Test, Ur: NEGATIVE

## 2018-05-08 LAB — LIPASE, BLOOD: Lipase: 34 U/L (ref 11–51)

## 2018-05-08 LAB — FIBRIN DERIVATIVES D-DIMER (ARMC ONLY): Fibrin derivatives D-dimer (ARMC): 387.91 ng/mL (FEU) (ref 0.00–499.00)

## 2018-05-08 LAB — TROPONIN I: Troponin I: <0.03 ng/mL (ref <0.03)

## 2018-05-08 MED ORDER — KETOROLAC TROMETHAMINE 30 MG/ML IJ SOLN
30.0000 mg | Freq: Once | INTRAMUSCULAR | Status: AC
  Administered 2018-05-08: 30 mg via INTRAMUSCULAR
  Filled 2018-05-08: qty 1

## 2018-05-08 MED ORDER — CEPHALEXIN 500 MG PO CAPS: 500.0000 mg | ORAL_CAPSULE | Freq: Three times a day (TID) | ORAL | 0 refills | Status: AC

## 2018-05-08 MED ORDER — NAPROXEN 375 MG PO TABS: 375.0000 mg | ORAL_TABLET | Freq: Two times a day (BID) | ORAL | 0 refills | Status: AC

## 2018-05-08 NOTE — ED Notes (Signed)
Pt presents with chest, side, abdominal pain x 4 weeks. She states that she came in today because the pain was worse when she awakened, and she came in later because she could not go to work due to the pain. Pt states pain has been intermittent until today, when it became constant. Pain described as stabbing, 8/10. Pt alert & oriented with NAD noted.

## 2018-05-08 NOTE — ED Provider Notes (Addendum)
[facility] Emergency Department Provider Note  ____________________________________________   I have reviewed the triage vital signs and the nursing notes. Where available I have reviewed prior notes and, if possible and indicated, outside hospital notes.    HISTORY  Chief Complaint Chest Pain and Abdominal Pain    HPI Yvonne Shea is a 28 y.o. female suffers from anxiety depression, PTSD, presents today complaining of pain in her chest wall on both sides which is worse when she changes position or touches it or pulls up.  She also states her breasts of been swelling, she has been "very hormonal" recently, and she wants to know if she is pregnant.  She has no shortness of breath no dyspnea.  The pain is in the muscles around her breasts, leading her to think that she might be pregnant.  Hurts to touch, or change position.  No change in anything else recently.  No exertional symptoms.  No personal family history of PE.  No recent travel no leg swelling, she denies any dysuria but she does have urinary frequency and a history of urinary tract infections.  She states all of the symptoms have been there for a month.  She denies vaginal discharge.  She denies any diarrhea.  She does suffer from constipation but states is been having normal bowel movements.  She has no headache stiff neck focal numbness or weakness, she has no skin rash, she has a long history of chest wall pain especially when she is anxious and she states she is more anxious than normal.  She does not have any SI or HI denies being abused.  Pain is not pleuritic.  Hurts when she picks things up.  Recent surgery no recent travel not on any birth control, no leg swelling, no early cardiac death in her family, no personal or family history of PE or DVT,  Is been trying over-the-counter medications with some success but the pain will come back. I asked her about radiation of the pain she states it goes "everywhere  in her body".  Past Medical History:  Diagnosis Date  . Anxiety   . Depression   . Mental disorder   . PTSD (post-traumatic stress disorder)     Patient Active Problem List   Diagnosis Date Noted  . Post-operative state 10/13/2016  . Indication for care in labor and delivery, antepartum 10/08/2016  . Pregnancy 09/11/2016  . Preterm uterine contractions 08/09/2016  . Flank pain, acute 07/12/2016  . UTI (urinary tract infection) during pregnancy, second trimester 06/11/2016  . Panic disorder 06/11/2016  . 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 Uterine contractions during pregnancy 05/28/2016  . First trimester screening     Past Surgical History:  Procedure Laterality Date  . APPENDECTOMY    . CESAREAN SECTION    . CESAREAN SECTION N/A 10/13/2016   Procedure: CESAREAN SECTION;  Surgeon: Feliberto GottronSchermerhorn, Ihor Austinhomas J, MD;  Location: [facility] ORS;  Service: Obstetrics;  Laterality: N/A;  Female born @ [id number] Weight: 8lbs 1oz Apagrs: 8/9  . CHOLECYSTECTOMY    . DILATION AND CURETTAGE OF UTERUS      Prior to Admission medications   Medication Sig Start Date End Date Taking? Authorizing Provider  amoxicillin (AMOXIL) 500 MG capsule Take 1 capsule (500 mg total) by mouth 3 (three) times daily. 11/15/17   Enid DerryWagner, Ashley, PA-C  cephALEXin (KEFLEX) 500 MG capsule Take 1 capsule (500 mg total) by mouth 3 (three) times daily. 02/24/18   Minna AntisPaduchowski, Kevin, MD  clonazePAM (KLONOPIN) 0.5 MG disintegrating tablet Take 1 tablet (0.5 mg total) by mouth 2 (two) times daily as needed for seizure (anxiety). Patient not taking: Reported on 06/04/2017 10/15/16   Ward, Elenora Fender, MD  cyclobenzaprine (FLEXERIL) 5 MG tablet Take 1-2 tablets 3 times daily as needed 11/15/17   Enid Derry, PA-C  diphenhydrAMINE (BENADRYL) 25 MG tablet Take 2 tablets (50 mg total) at bedtime as needed by mouth for sleep. Patient not taking: Reported on 06/04/2017 02/06/17   Sharman Cheek, MD  escitalopram (LEXAPRO) 10  MG tablet Take 1 tablet (10 mg total) by mouth daily. Patient not taking: Reported on 06/04/2017 10/15/16 10/15/17  Ward, Elenora Fender, MD  hydrOXYzine (ATARAX/VISTARIL) 25 MG tablet Take 1 tablet (25 mg total) by mouth 3 (three) times daily as needed. 03/17/18   Triplett, Rulon Eisenmenger B, FNP  ibuprofen (ADVIL,MOTRIN) 600 MG tablet Take 1 tablet (600 mg total) by mouth every 6 (six) hours. Patient not taking: Reported on 06/04/2017 10/15/16   Ward, Elenora Fender, MD  naproxen (NAPROSYN) 500 MG tablet Take 1 tablet (500 mg total) by mouth 2 (two) times daily with a meal. 06/04/17   Jene Every, MD  ondansetron (ZOFRAN) 4 MG tablet Take 1 tablet (4 mg total) by mouth daily as needed for nausea or vomiting. 11/15/17 11/15/18  Enid Derry, PA-C  ondansetron (ZOFRAN-ODT) 4 MG disintegrating tablet Take 1 tablet (4 mg total) by mouth every 8 (eight) hours as needed for nausea or vomiting. 03/17/18   Triplett, Cari B, FNP  oxyCODONE-acetaminophen (PERCOCET/ROXICET) 5-325 MG tablet Take 1 tablet by mouth every 4 (four) hours as needed (pain scale 4-7). Patient not taking: Reported on 06/04/2017 10/15/16   Ward, Elenora Fender, MD    Allergies Sulfa antibiotics  No family history on file.  Social History Social History   Tobacco Use  . Smoking status: Current Every Day Smoker    Packs/day: 1.00    Types: Cigarettes  . Smokeless tobacco: Never Used  Substance Use Topics  . Alcohol use: No  . Drug use: No    Review of Systems Constitutional: No fever/chills Eyes: No visual changes. ENT: No sore throat. No stiff neck no neck pain Cardiovascular: See HPI regarding chest pain. Respiratory: Denies shortness of breath. Gastrointestinal:   no vomiting.  No diarrhea.  No constipation. Genitourinary: Negative for dysuria. Musculoskeletal: Negative lower extremity swelling Skin: Negative for rash. Neurological: Negative for severe headaches, focal weakness or  numbness.   ____________________________________________   PHYSICAL EXAM:  VITAL SIGNS: ED Triage Vitals  Enc Vitals Group     BP 05/08/18 1758 116/63     Pulse Rate 05/08/18 1758 79     Resp 05/08/18 1758 16     Temp 05/08/18 1758 98.1 F (36.7 C)     Temp Source 05/08/18 1758 Oral     SpO2 05/08/18 1758 100 %     Weight 05/08/18 1759 140 lb (63.5 kg)     Height 05/08/18 1759 5\' 4"  (1.626 m)     Head Circumference --      Peak Flow --      Pain Score 05/08/18 1759 8     Pain Loc --      Pain Edu? --      Excl. in GC? --     Constitutional: Alert and oriented. Well appearing and in no acute distress. Eyes: Conjunctivae are normal Head: Atraumatic HEENT: No congestion/rhinnorhea. Mucous membranes are moist.  Oropharynx non-erythematous Neck:   Nontender with no  meningismus, no masses, no stridor Cardiovascular: Normal rate, regular rhythm. Grossly normal heart sounds.  Good peripheral circulation Chest: Patient declines female chaperone from nurse but does want her husband in the room for exam.  I did not expose her breast, however, the tissue underneath her breast especially the rib cage is somewhat tender.  When I touch these areas patient states "ouch that is the pain right there" and pulls back.  No shingles or crepitus noted no flail chest.. Respiratory: Normal respiratory effort.  No retractions. Lungs CTAB. Abdominal: Soft and nontender. No distention. No guarding no rebound Back:  There is no focal tenderness or step off.  there is no midline tenderness there are no lesions noted. there is no CVA tenderness Musculoskeletal: No lower extremity tenderness, no upper extremity tenderness. No joint effusions, no DVT signs strong distal pulses no edema Neurologic:  Normal speech and language. No gross focal neurologic deficits are appreciated.  Skin:  Skin is warm, dry and intact. No rash noted. Psychiatric: Mood and affect are anxious. Speech and behavior are  normal.  ____________________________________________   LABS (all labs ordered are listed, but only abnormal results are displayed)  Labs Reviewed  URINALYSIS, COMPLETE (UACMP) WITH MICROSCOPIC - Abnormal; Notable for the following components:      Result Value   Color, Urine AMBER (*)    APPearance HAZY (*)    Nitrite POSITIVE (*)    Bacteria, UA RARE (*)    All other components within normal limits  URINE CULTURE  LIPASE, BLOOD  COMPREHENSIVE METABOLIC PANEL  CBC  TROPONIN I  POC URINE PREG, ED  POCT PREGNANCY, URINE    Pertinent labs  results that were available during my care of the patient were reviewed by me and considered in my medical decision making (see chart for details). ____________________________________________  EKG  I personally interpreted any EKGs ordered by me or triage Notes rhythm, nonspecific ST changes, Q waves inferiorly in the 3, which is unchanged from March 2019 ____________________________________________  RADIOLOGY  Pertinent labs & imaging results that were available during my care of the patient were reviewed by me and considered in my medical decision making (see chart for details). If possible, patient and/or family made aware of any abnormal findings.  No results found. ____________________________________________    PROCEDURES  Procedure(s) performed: None  Procedures  Critical Care performed: None  ____________________________________________   INITIAL IMPRESSION / ASSESSMENT AND PLAN / ED COURSE  Pertinent labs & imaging results that were available during my care of the patient were reviewed by me and considered in my medical decision making (see chart for details).  Here with very reproducible chest wall pain, the conviction that she might be pregnant, and pain that goes "everywhere in her body" she is PERC negative and I have low suspicion for PE, there are old Q waves in lead III which are of some concern however, again  no real risk factors for PE and the EKG is not changed significantly since March 2019.  I will send a d-dimer even though she is PERC negative.  Is my hope that that will help us rule out the possibility of PE which I have low suspicion of.  Patient has been seen here for chest wall pain before.  EKG is otherwise unchanged.  Troponin is negative despite a month of pain she has no exertional symptoms, this is more emotionally mediated as she describes it.  I do not see any indication for CT scan otherwise.  I will, however, do a chest x-ray.  Low suspicion for pneumonia or pneumothorax.  Do not suspect ACS and low suspicion for myocarditis endocarditis pericarditis.  Patient does have nitrite positive urine, she does not have reproducible flank pain, suggestive of pyelonephritis however she does have urinary frequency.  Unclear if this could be early Pilo although again symptoms for 1 month.  Patient and family initially were wondering if I can check blood pregnancy test because they know that is more efficient.  I have offered to do so although with a month of symptoms and negative pregnancy test I have low suspicion for pregnancy, but they do not want that to be done after further consideration.  I will give the patient some pain medication to see if this calms things down and we will see what her chest x-ray and d-dimer show  ----------------------------------------- 9:35 PM on 05/08/2018 -----------------------------------------  Negative, possible UTI will treat, urine culture sent, at this time, there does not appear to be clinical evidence to support the diagnosis of pulmonary embolus, dissection, myocarditis, endocarditis, pericarditis, pericardial tamponade, acute coronary syndrome, pneumothorax, pneumonia, or any other acute intrathoracic pathology that will require admission or acute intervention. Nor is there evidence of any significant intra-abdominal pathology causing this discomfort.  Patient's  pain is much better controlled after nonsteroidal pain medication.    ____________________________________________   FINAL CLINICAL IMPRESSION(S) / ED DIAGNOSES  Final diagnoses:  None      This chart was dictated using voice recognition software.  Despite best efforts to proofread,  errors can occur which can change meaning.     Jeanmarie Plant, MD 05/08/18 2010    Jeanmarie Plant, MD 05/08/18 2135

## 2018-05-08 NOTE — ED Triage Notes (Signed)
Pt in via POV, reports multiple complaints, reports chest pain, lower abdominal pain, pain radiating down both sides.  Pt reports pain has been going on x 4 weeks, denies any N/V/D or other associated symptoms.  Pt ambulatory to triage, NAD noted at this time.

## 2018-05-11 LAB — URINE CULTURE: Culture: >=100000 — AB

## 2019-08-23 IMAGING — CR DG CHEST 2V
1 series · 2 of 2 positions shown · non-contrast
Comparison: 06/04/2016

CLINICAL DATA: Chest pain

EXAM:
CHEST - 2 VIEW

[Series 1: dg chest 2 view · 0.14mm/px · 2 of 2 slices shown]
[im 1/2]
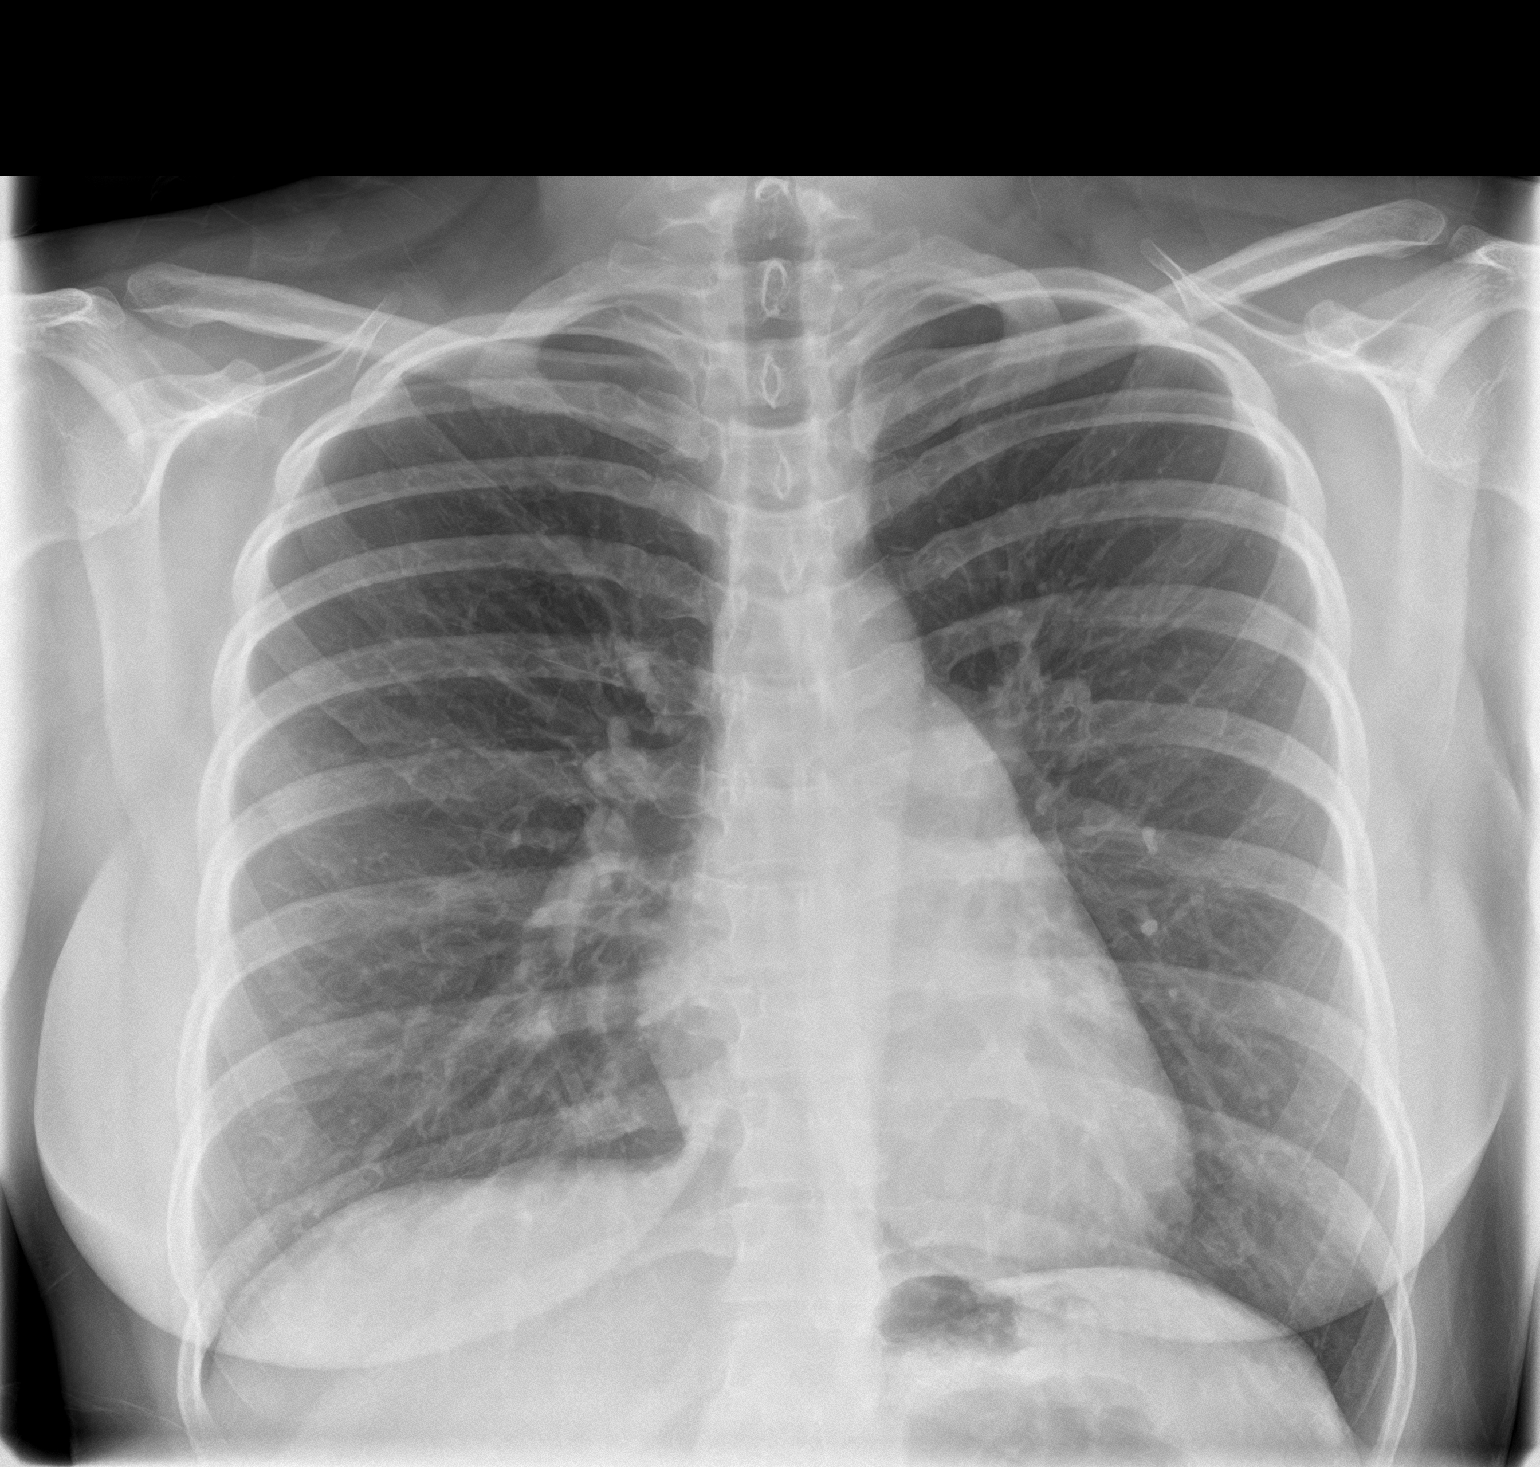
[im 2/2]
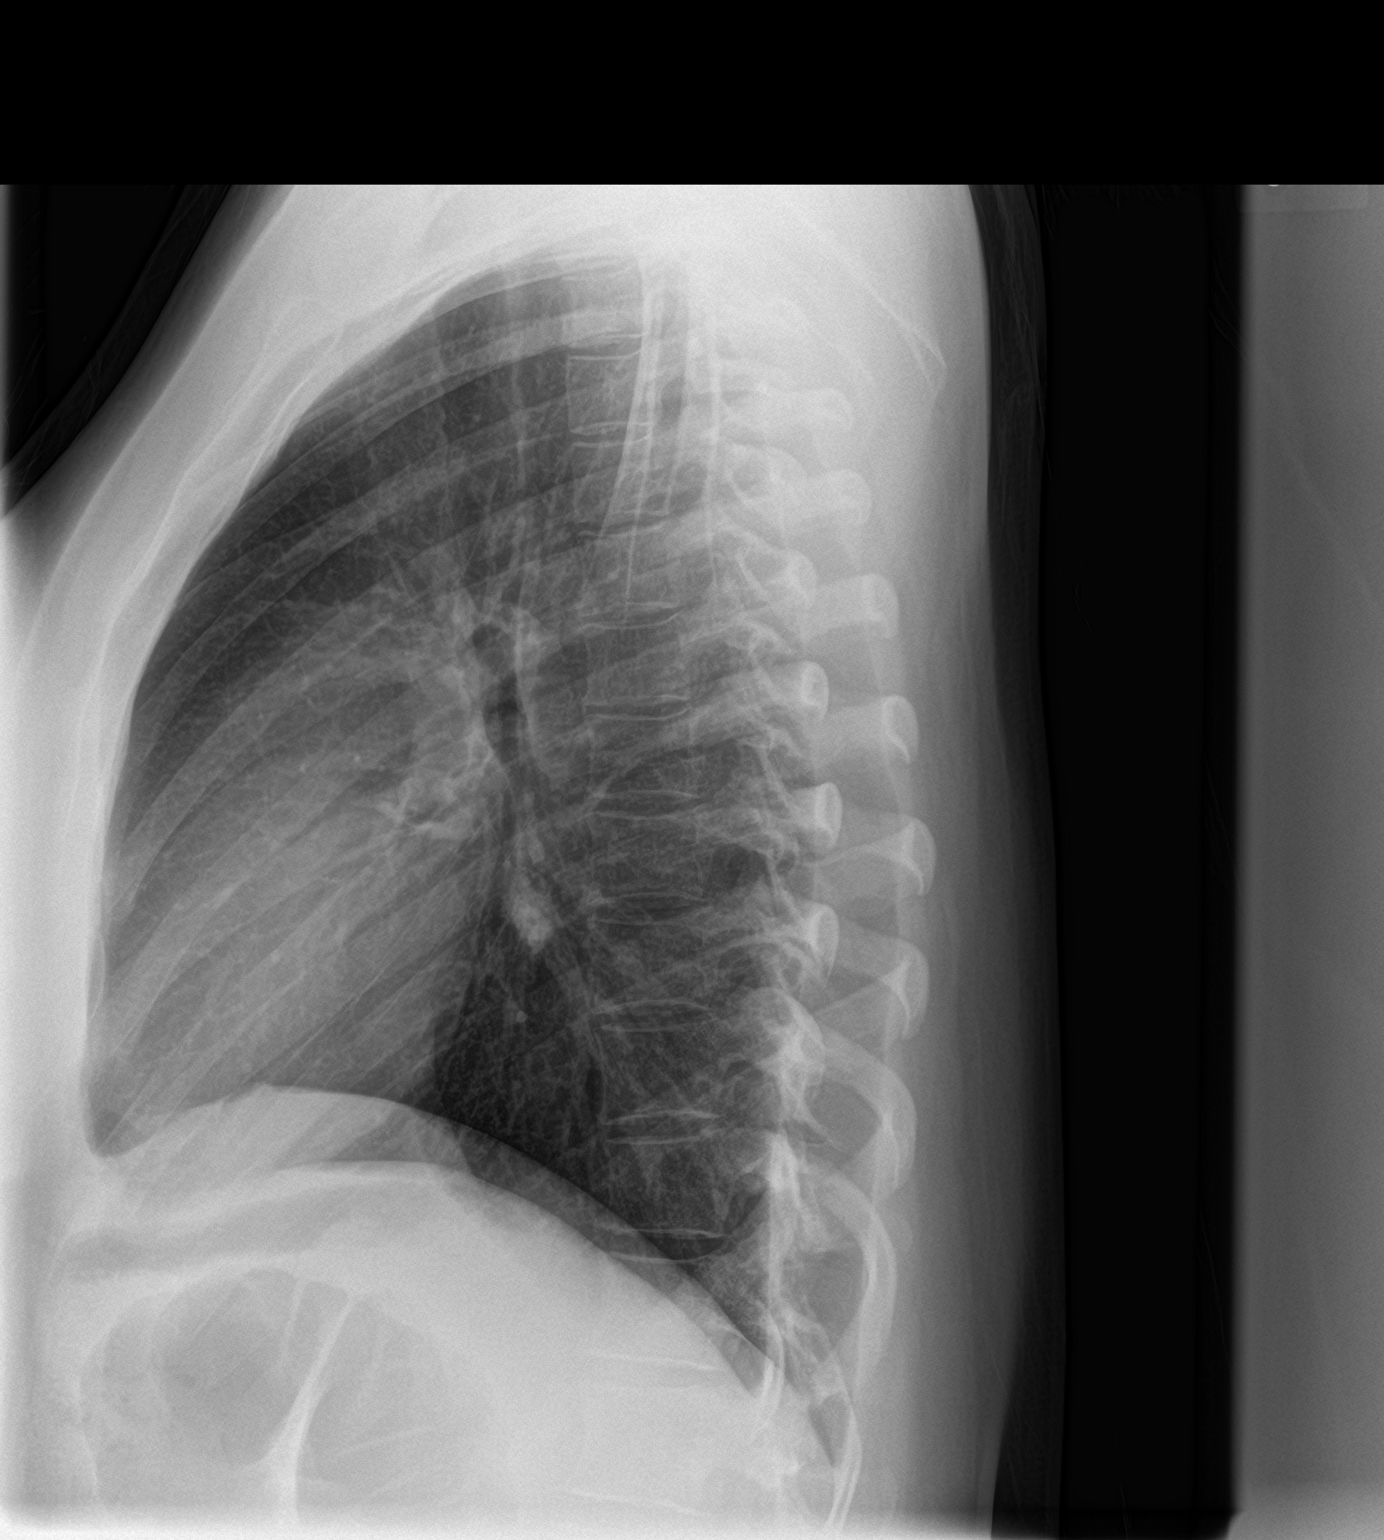

[2 of 2 positions shown; findings below may reference images not displayed]

FINDINGS: The heart size and mediastinal contours are within normal limits.
Both lungs are clear. The visualized skeletal structures are
unremarkable.
IMPRESSION: No active cardiopulmonary disease.

## 2020-02-02 IMAGING — US US TRANSVAGINAL NON-OB
1 series · 13 of 25 positions shown · non-contrast
Comparison: None

CLINICAL DATA: Vaginal bleeding for 1 day.

EXAM:
TRANSABDOMINAL AND TRANSVAGINAL ULTRASOUND OF PELVIS
TECHNIQUE: Both transabdominal and transvaginal ultrasound examinations of the
pelvis were performed. Transabdominal technique was performed for
global imaging of the pelvis including uterus, ovaries, adnexal
regions, and pelvic cul-de-sac. It was necessary to proceed with
endovaginal exam following the transabdominal exam to visualize the
uterus, endometrium and ovaries..

[Series 1: us transvaginal non-ob · 13 of 68 slices shown]
[im 1/68]
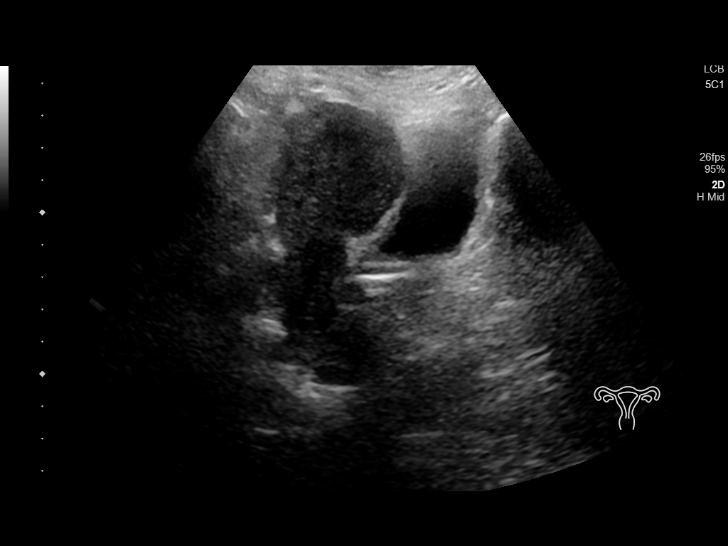
[im 6/68]
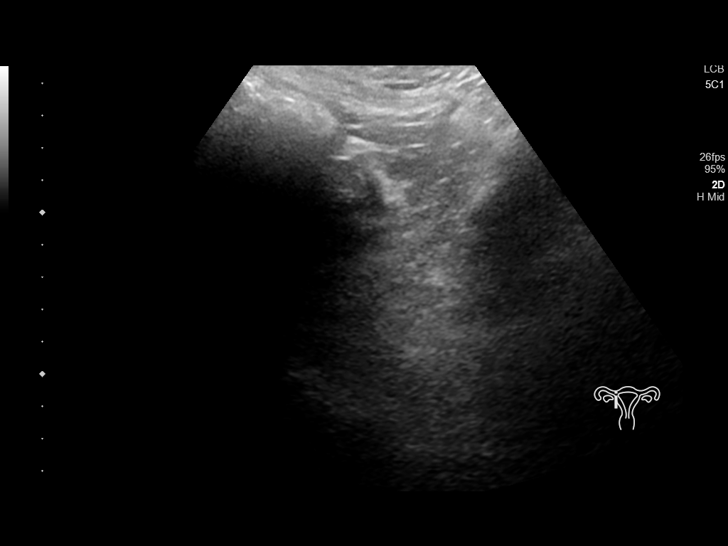
[im 12/68]
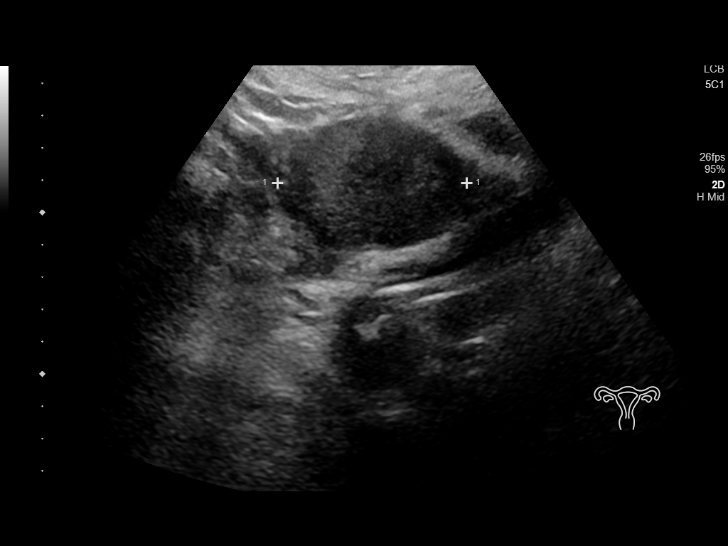
[im 17/68]
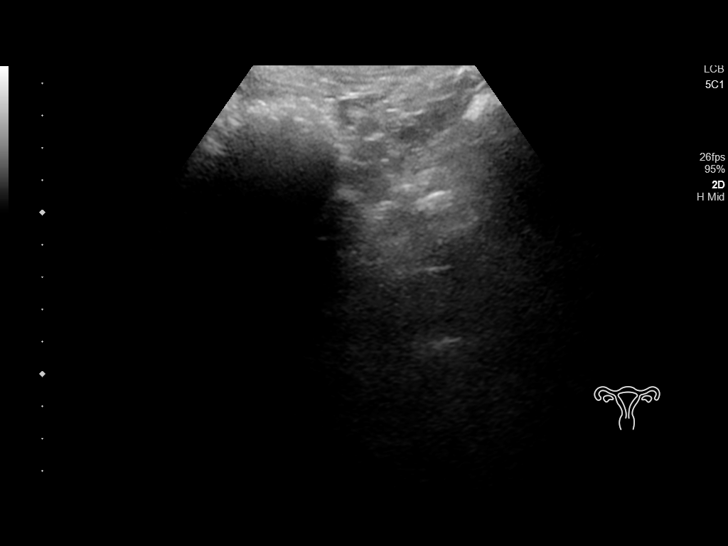
[im 23/68]
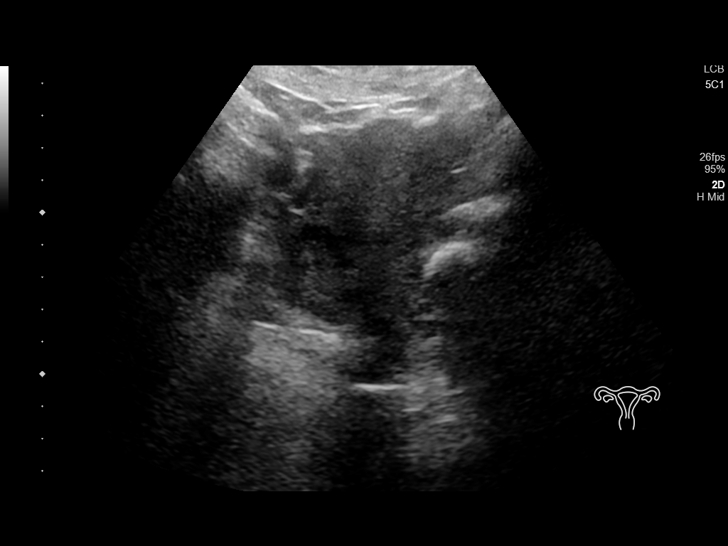
[im 28/68]
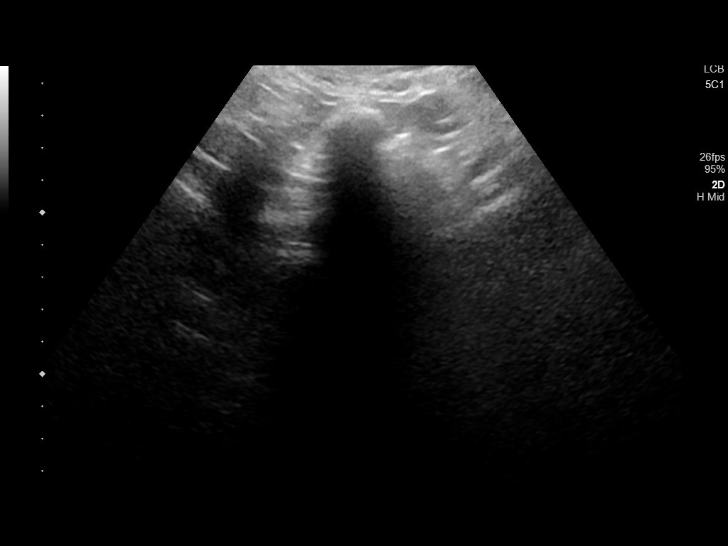
[im 34/68]
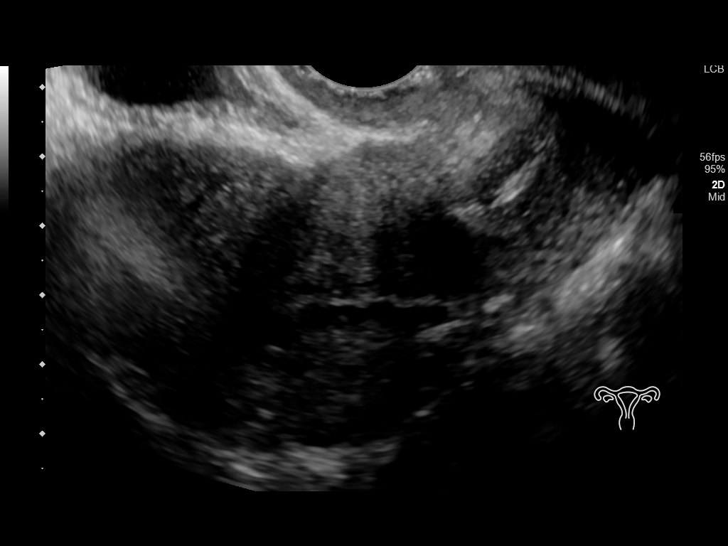
[im 40/68]
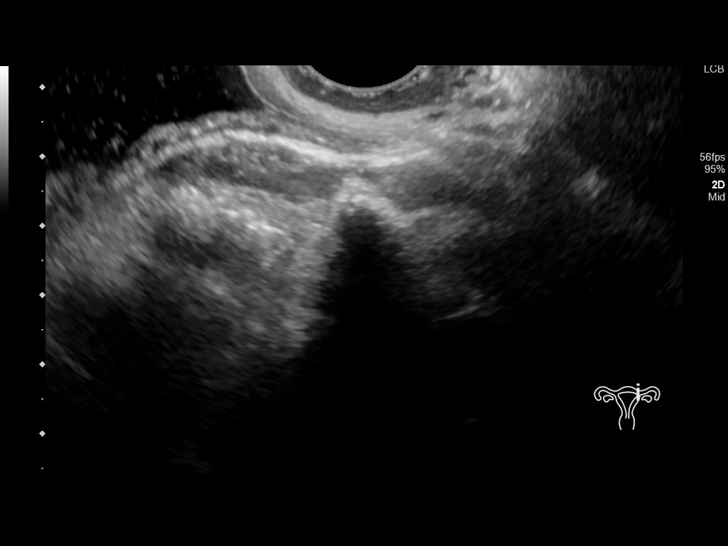
[im 45/68]
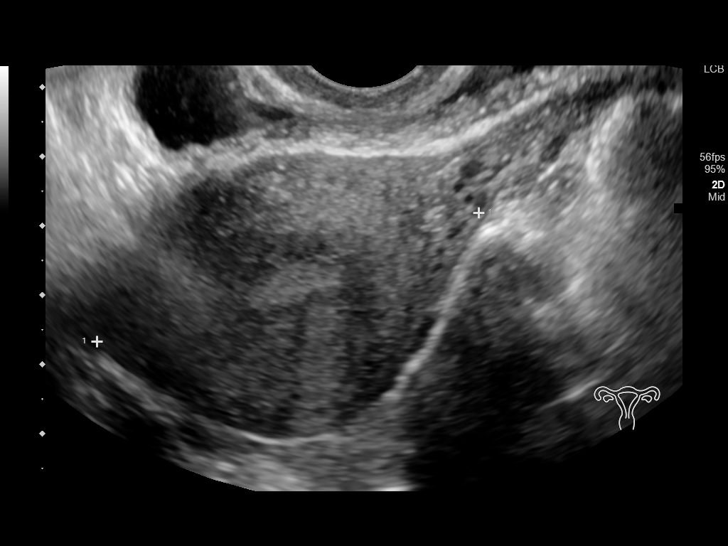
[im 51/68]
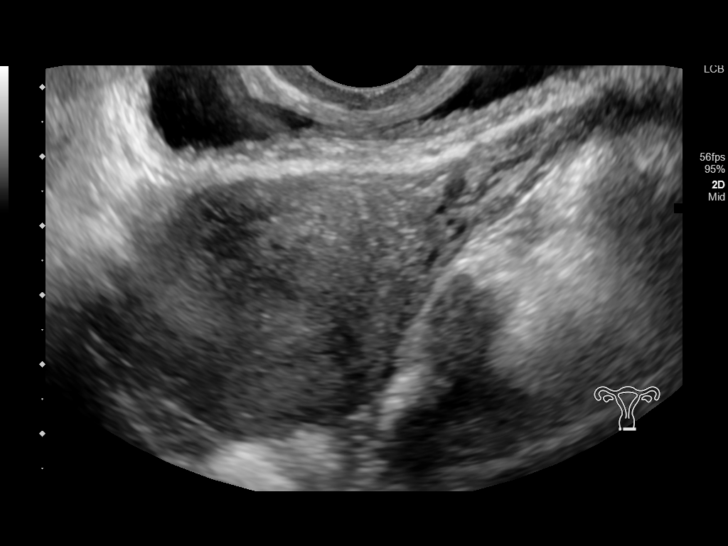
[im 56/68]
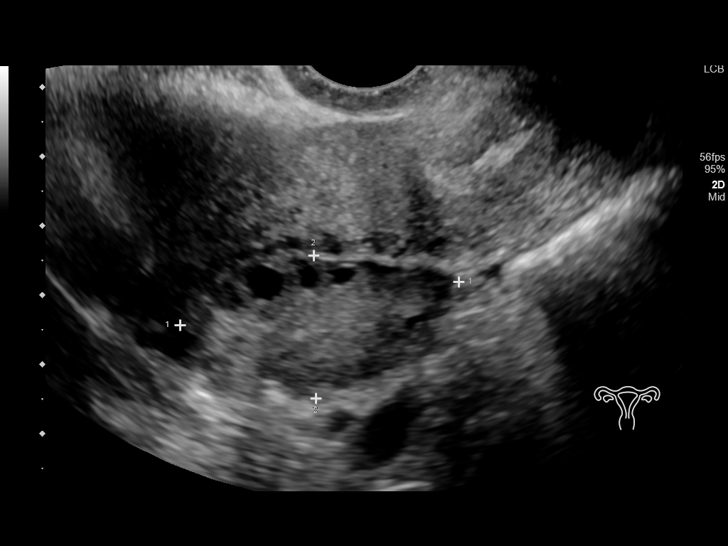
[im 62/68]
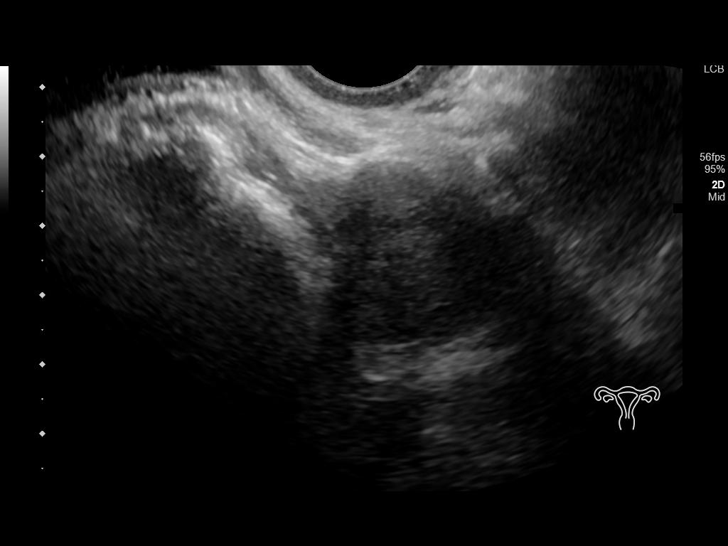
[im 68/68]
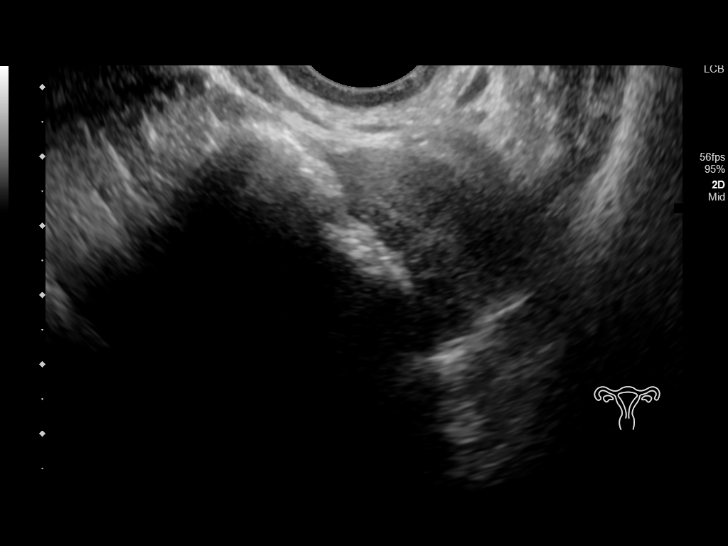

[13 of 25 positions shown; findings below may reference images not displayed]

FINDINGS: Uterus

Measurements: 9.0 x 3.9 x 5.8 cm = volume: 109.4 mL. No fibroids or
other mass visualized.

Endometrium

Thickness: 7.3 mm.  No focal abnormality visualized.

Right ovary

Measurements: 4.1 x 2.1 x 2.1 cm = volume: 9.3 mL. Normal
appearance/no adnexal mass.

Left ovary

Measurements: 2.8 x 2.5 x 1.8 cm = volume: 6.6 mL. Normal
appearance/no adnexal mass.

Other findings

No abnormal free fluid.
IMPRESSION: 1. Normal exam.
2. If bleeding remains unresponsive to hormonal or medical therapy,
sonohysterogram should be considered for focal lesion work-up. (Ref:
Radiological Reasoning: Algorithmic Workup of Abnormal Vaginal
Bleeding with Endovaginal Sonography and Sonohysterography. AJR
[id number]; [id number])
